# Patient Record
Sex: Female | Born: 2000 | Race: Black or African American | Hispanic: No | Marital: Single | Smoking: Never smoker
Health system: Southern US, Community
[De-identification: ages and names within clinical notes are randomized; demographics above are authoritative.]

## PROBLEM LIST (undated history)

## (undated) DIAGNOSIS — L940 Localized scleroderma [morphea]: Secondary | ICD-10-CM

## (undated) DIAGNOSIS — G43909 Migraine, unspecified, not intractable, without status migrainosus: Secondary | ICD-10-CM

## (undated) DIAGNOSIS — Z862 Personal history of diseases of the blood and blood-forming organs and certain disorders involving the immune mechanism: Secondary | ICD-10-CM

## (undated) DIAGNOSIS — D649 Anemia, unspecified: Secondary | ICD-10-CM

## (undated) DIAGNOSIS — I890 Lymphedema, not elsewhere classified: Secondary | ICD-10-CM

## (undated) HISTORY — DX: Lymphedema, not elsewhere classified: I89.0

## (undated) HISTORY — DX: Personal history of diseases of the blood and blood-forming organs and certain disorders involving the immune mechanism: Z86.2

## (undated) HISTORY — DX: Localized scleroderma (morphea): L94.0

## (undated) HISTORY — DX: Anemia, unspecified: D64.9

## (undated) HISTORY — PX: TONSILLECTOMY: SUR1361

## (undated) HISTORY — DX: Migraine, unspecified, not intractable, without status migrainosus: G43.909

---
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
## 2020-07-16 LAB — HM HEPATITIS C SCREENING LAB: HM Hepatitis Screen: NEGATIVE

## 2021-02-09 ENCOUNTER — Other Ambulatory Visit: Payer: Self-pay

## 2021-02-09 ENCOUNTER — Ambulatory Visit (INDEPENDENT_AMBULATORY_CARE_PROVIDER_SITE_OTHER): Payer: No Typology Code available for payment source | Admitting: Obstetrics & Gynecology

## 2021-02-09 ENCOUNTER — Encounter (HOSPITAL_BASED_OUTPATIENT_CLINIC_OR_DEPARTMENT_OTHER): Payer: Self-pay | Admitting: Obstetrics & Gynecology

## 2021-02-09 ENCOUNTER — Other Ambulatory Visit (HOSPITAL_COMMUNITY)
Admission: RE | Admit: 2021-02-09 | Discharge: 2021-02-09 | Disposition: A | Discharge status: Home / Self Care | Payer: BLUE CROSS/BLUE SHIELD | Source: Ambulatory Visit | Attending: Obstetrics & Gynecology | Admitting: Obstetrics & Gynecology

## 2021-02-09 VITALS — BP 154/77 | HR 57 | Ht 66.0 in | Wt 286.4 lb

## 2021-02-09 DIAGNOSIS — D509 Iron deficiency anemia, unspecified: Secondary | ICD-10-CM | POA: Insufficient documentation

## 2021-02-09 DIAGNOSIS — R300 Dysuria: Secondary | ICD-10-CM | POA: Insufficient documentation

## 2021-02-09 DIAGNOSIS — Z862 Personal history of diseases of the blood and blood-forming organs and certain disorders involving the immune mechanism: Secondary | ICD-10-CM

## 2021-02-09 DIAGNOSIS — N926 Irregular menstruation, unspecified: Secondary | ICD-10-CM

## 2021-02-09 DIAGNOSIS — N898 Other specified noninflammatory disorders of vagina: Secondary | ICD-10-CM | POA: Diagnosis not present

## 2021-02-09 DIAGNOSIS — I890 Lymphedema, not elsewhere classified: Secondary | ICD-10-CM | POA: Diagnosis not present

## 2021-02-09 DIAGNOSIS — R102 Pelvic and perineal pain: Secondary | ICD-10-CM

## 2021-02-09 NOTE — Progress Notes (Signed)
GYNECOLOGY  VISIT  CC:   pain with urination during menstrual cycle  HPI: 21 y.o. G0 Single Black or [place] American female here for complaint of painful urination during menstrual cycle up to a year ago.  Cycles are not regular.  She started her cycles when she was 12 and they've never been regular.  Last year, she did have an episode of bleeding that lasted four months.  She was very anemia and needed two iron infusions.  Currently, she is having pain down low that feels like it is near her uterus.  She has used contraceptives in the past.  She has taken POP which made her sluggish.  She's been on one combination OCP and this didn't really help cycles.  She's also tried Nuva ring and this helped with cycles but this also made her feel sluggish.   She has been SA.  Has not had recent STD testing.  Will obtain today.  Had Hb done 01/30/2021 showing some anemia.  No iron studies were done.     Patient Active Problem List   Diagnosis Date Noted   History of iron deficiency anemia 02/09/2021   Dysuria 02/09/2021    Past Medical History:  Diagnosis Date   History of anemia    h/o iron infusions x 2   Migraines     Past Surgical History:  Procedure Laterality Date   TONSILLECTOMY      MEDS:   Current Outpatient Medications on File Prior to Visit  Medication Sig Dispense Refill   Rimegepant Sulfate (NURTEC) 75 MG TBDP Take 75 mg by mouth every other day.     No current facility-administered medications on file prior to visit.    ALLERGIES: Patient has no allergy information on record.  Family History  Problem Relation Age of Onset   Breast cancer Maternal Grandmother    Hypertension Mother    Miscarriages / Stillbirths Other    Cancer Other     SH:  single, non smoker  Review of Systems  Genitourinary:        Irregular bleeding  All other systems reviewed and are negative.  PHYSICAL EXAMINATION:    BP (!) 154/77 (BP Location: Right Arm, Patient Position: Sitting,  Cuff Size: Large)    Pulse (!) 57    Ht 5\' 6"  (1.676 m) Comment: reported   Wt 286 lb 6.4 oz (129.9 kg)    LMP 01/28/2021 (Approximate)    BMI 46.23 kg/m     General appearance: alert, cooperative and appears stated age Abdomen: soft, mild RLQ tenderness, no rebound or guarding, bowel sounds normal; no masses,  no organomegaly Lymph:  no inguinal LAD noted  Pelvic: External genitalia:  no lesions              Urethra:  normal appearing urethra with no masses, tenderness or lesions              Bartholins and Skenes: normal                 Vagina: normal appearing vagina with normal color, frothy discharge present, no lesions              Cervix: no lesions              Bimanual Exam:  Uterus:  normal size, contour, position, consistency, mobility, non-tender              Adnexa: no mass, fullness, tenderness  Chaperone, 01/30/2021, CMA, was present for  exam.  Assessment/Plan:There are no diagnoses linked to this encounter. 1. History of iron deficiency anemia - had recent hb of 10.6 on 10/30/2021 - Iron, TIBC and Ferritin Panel  2. Dysuria - UA/M w/rflx Culture, Routine  3. Vaginal discharge - Cervicovaginal ancillary only( [facility])  4. Lymphedema - will see if can refer her to lymphedema clinic  5. Pelvic pain - consider pelvic ultrasound  6. Irregular periods/menstrual cycles - pt considering options for treatment of irregular bleeding

## 2021-02-10 ENCOUNTER — Other Ambulatory Visit (HOSPITAL_BASED_OUTPATIENT_CLINIC_OR_DEPARTMENT_OTHER): Payer: Self-pay | Admitting: Obstetrics & Gynecology

## 2021-02-10 ENCOUNTER — Encounter (HOSPITAL_BASED_OUTPATIENT_CLINIC_OR_DEPARTMENT_OTHER): Payer: Self-pay | Admitting: Obstetrics & Gynecology

## 2021-02-10 DIAGNOSIS — R102 Pelvic and perineal pain: Secondary | ICD-10-CM

## 2021-02-10 DIAGNOSIS — L940 Localized scleroderma [morphea]: Secondary | ICD-10-CM | POA: Insufficient documentation

## 2021-02-10 DIAGNOSIS — I890 Lymphedema, not elsewhere classified: Secondary | ICD-10-CM | POA: Insufficient documentation

## 2021-02-10 DIAGNOSIS — G43909 Migraine, unspecified, not intractable, without status migrainosus: Secondary | ICD-10-CM | POA: Insufficient documentation

## 2021-02-10 DIAGNOSIS — D649 Anemia, unspecified: Secondary | ICD-10-CM | POA: Insufficient documentation

## 2021-02-10 DIAGNOSIS — R300 Dysuria: Secondary | ICD-10-CM

## 2021-02-10 LAB — UA/M W/RFLX CULTURE, ROUTINE

## 2021-02-10 LAB — IRON,TIBC AND FERRITIN PANEL
Ferritin: 230 ng/mL — ABNORMAL HIGH (ref 15–150)
Iron Saturation: 15 % (ref 15–55)
Iron: 45 ug/dL (ref 27–159)
Total Iron Binding Capacity: 306 ug/dL (ref 250–450)
UIBC: 261 ug/dL (ref 131–425)

## 2021-02-10 LAB — CERVICOVAGINAL ANCILLARY ONLY
Bacterial Vaginitis (gardnerella): POSITIVE — AB
Candida Glabrata: NEGATIVE
Candida Vaginitis: POSITIVE — AB
Chlamydia: NEGATIVE
Comment: NEGATIVE
Comment: NEGATIVE
Comment: NEGATIVE
Comment: NEGATIVE
Comment: NEGATIVE
Comment: NORMAL
Neisseria Gonorrhea: NEGATIVE
Trichomonas: NEGATIVE

## 2021-02-11 ENCOUNTER — Other Ambulatory Visit (HOSPITAL_BASED_OUTPATIENT_CLINIC_OR_DEPARTMENT_OTHER): Payer: Self-pay

## 2021-02-11 ENCOUNTER — Other Ambulatory Visit (HOSPITAL_BASED_OUTPATIENT_CLINIC_OR_DEPARTMENT_OTHER): Payer: Self-pay | Admitting: *Deleted

## 2021-02-11 MED ORDER — FLUCONAZOLE 150 MG PO TABS
150.0000 mg | ORAL_TABLET | Freq: Once | ORAL | 0 refills | Status: AC
  Filled 2021-02-11: qty 2, 2d supply, fill #0

## 2021-02-11 MED ORDER — METRONIDAZOLE 0.75 % VA GEL
1.0000 | Freq: Every day | VAGINAL | 0 refills | Status: DC
  Filled 2021-02-11: qty 70, 5d supply, fill #0

## 2021-02-11 MED ORDER — METRONIDAZOLE 0.75 % VA GEL: 1.0000 | Freq: Every day | VAGINAL | Status: DC

## 2021-02-11 NOTE — Addendum Note (Signed)
Addended by: Harrie Jeans on: 02/11/2021 02:47 PM   Modules accepted: Orders

## 2021-02-11 NOTE — Progress Notes (Deleted)
° ° °  Patient referred by Shanon Rosser, PA-C for ***  Subjective:   Tammy [name], female    DOB: 09-07-2000, 21 y.o.   MRN: [id number]  *** No chief complaint on file.   *** HPI  21 y.o. *** female with ***  *** Past Medical History:  Diagnosis Date   History of anemia    h/o iron infusions x 2   Localized scleroderma (morphea)    Lymphedema    Migraines     *** Past Surgical History:  Procedure Laterality Date   TONSILLECTOMY      *** Social History   Tobacco Use  Smoking Status Never  Smokeless Tobacco Never    Social History   Substance and Sexual Activity  Alcohol Use Never    *** Family History  Problem Relation Age of Onset   Breast cancer Maternal Grandmother    Hypertension Mother    Miscarriages / Stillbirths Other    Cancer Other     *** Current Outpatient Medications on File Prior to Visit  Medication Sig Dispense Refill   fluconazole (DIFLUCAN) 150 MG tablet Take 1 tablet (150 mg total) by mouth once for 1 dose. Take 1 tablet once then repeat in 72 hours. 2 tablet 0   metroNIDAZOLE (METROGEL) 0.75 % vaginal gel Place 1 applicatorful vaginally at bedtime. Use for 5 nights. 70 g 0   Rimegepant Sulfate (NURTEC) 75 MG TBDP Take 75 mg by mouth every other day.     No current facility-administered medications on file prior to visit.    Cardiovascular and other pertinent studies:  *** EKG ***/***/202***: ***  ***  *** Recent labs: 01/09/2021: Glucose 83, BUN/Cr 10/1.0. EGFR 83. Na/K 138/4.2. Rest of the CMP normal H/H 11.7/36.8. MCV 92.0. Platelets 392 ***HbA1C ***% Chol ***, TG ***, HDL ***, LDL *** ***TSH 2.76 normal   *** ROS      *** There were no vitals filed for this visit.   There is no height or weight on file to calculate BMI. There were no vitals filed for this visit.  *** Objective:   Physical Exam    ***     Assessment & Recommendations:   ***  ***  Thank you for referring the patient to [place].  Please feel free to contact with any questions.   Nigel Mormon, MD Pager: [phone] Office: [phone]

## 2021-02-11 NOTE — Progress Notes (Signed)
Rx sent in for treatment of BV and yeast

## 2021-02-12 ENCOUNTER — Other Ambulatory Visit (HOSPITAL_BASED_OUTPATIENT_CLINIC_OR_DEPARTMENT_OTHER): Payer: Self-pay | Admitting: Nurse Practitioner

## 2021-02-12 ENCOUNTER — Other Ambulatory Visit (HOSPITAL_BASED_OUTPATIENT_CLINIC_OR_DEPARTMENT_OTHER): Payer: Self-pay

## 2021-02-12 ENCOUNTER — Ambulatory Visit (HOSPITAL_BASED_OUTPATIENT_CLINIC_OR_DEPARTMENT_OTHER): Payer: No Typology Code available for payment source | Admitting: Nurse Practitioner

## 2021-02-12 DIAGNOSIS — Z20828 Contact with and (suspected) exposure to other viral communicable diseases: Secondary | ICD-10-CM

## 2021-02-12 MED ORDER — OSELTAMIVIR PHOSPHATE 75 MG PO CAPS
75.0000 mg | ORAL_CAPSULE | Freq: Every day | ORAL | 0 refills | Status: DC
  Filled 2021-02-12: qty 10, 10d supply, fill #0

## 2021-02-15 LAB — UA/M W/RFLX CULTURE, ROUTINE
Bilirubin, UA: NEGATIVE
Glucose, UA: NEGATIVE
Ketones, UA: NEGATIVE
Leukocytes,UA: NEGATIVE
Nitrite, UA: NEGATIVE
Protein,UA: NEGATIVE
RBC, UA: NEGATIVE
Specific Gravity, UA: 1.026 (ref 1.005–1.030)
Urobilinogen, Ur: 0.2 mg/dL (ref 0.2–1.0)
pH, UA: 5.5 (ref 5.0–7.5)

## 2021-02-15 LAB — MICROSCOPIC EXAMINATION
Casts: NONE SEEN /lpf
Epithelial Cells (non renal): >10 /hpf — AB (ref 0–10)
RBC, Urine: NONE SEEN /hpf (ref 0–2)

## 2021-02-15 LAB — URINE CULTURE, REFLEX

## 2021-02-20 ENCOUNTER — Encounter (HOSPITAL_BASED_OUTPATIENT_CLINIC_OR_DEPARTMENT_OTHER): Payer: Self-pay | Admitting: Obstetrics & Gynecology

## 2021-02-20 ENCOUNTER — Ambulatory Visit: Payer: Self-pay | Admitting: Cardiology

## 2021-02-24 ENCOUNTER — Other Ambulatory Visit (HOSPITAL_BASED_OUTPATIENT_CLINIC_OR_DEPARTMENT_OTHER): Payer: Self-pay

## 2021-02-25 ENCOUNTER — Ambulatory Visit (HOSPITAL_BASED_OUTPATIENT_CLINIC_OR_DEPARTMENT_OTHER): Payer: No Typology Code available for payment source | Admitting: Obstetrics & Gynecology

## 2021-02-25 ENCOUNTER — Other Ambulatory Visit (HOSPITAL_BASED_OUTPATIENT_CLINIC_OR_DEPARTMENT_OTHER): Payer: No Typology Code available for payment source

## 2021-02-26 ENCOUNTER — Other Ambulatory Visit (HOSPITAL_BASED_OUTPATIENT_CLINIC_OR_DEPARTMENT_OTHER): Payer: Self-pay

## 2021-02-26 ENCOUNTER — Encounter (HOSPITAL_BASED_OUTPATIENT_CLINIC_OR_DEPARTMENT_OTHER): Payer: Self-pay | Admitting: Nurse Practitioner

## 2021-02-26 ENCOUNTER — Ambulatory Visit (HOSPITAL_BASED_OUTPATIENT_CLINIC_OR_DEPARTMENT_OTHER): Payer: Self-pay | Admitting: Nurse Practitioner

## 2021-02-26 ENCOUNTER — Ambulatory Visit (HOSPITAL_BASED_OUTPATIENT_CLINIC_OR_DEPARTMENT_OTHER): Payer: No Typology Code available for payment source | Admitting: Nurse Practitioner

## 2021-02-26 ENCOUNTER — Other Ambulatory Visit: Payer: Self-pay

## 2021-02-26 VITALS — BP 117/72 | HR 90 | Ht 66.0 in | Wt 292.0 lb

## 2021-02-26 DIAGNOSIS — M2550 Pain in unspecified joint: Secondary | ICD-10-CM

## 2021-02-26 DIAGNOSIS — G43719 Chronic migraine without aura, intractable, without status migrainosus: Secondary | ICD-10-CM

## 2021-02-26 DIAGNOSIS — Z6841 Body Mass Index (BMI) 40.0 and over, adult: Secondary | ICD-10-CM | POA: Diagnosis not present

## 2021-02-26 DIAGNOSIS — I890 Lymphedema, not elsewhere classified: Secondary | ICD-10-CM

## 2021-02-26 DIAGNOSIS — R109 Unspecified abdominal pain: Secondary | ICD-10-CM

## 2021-02-26 MED ORDER — TOPIRAMATE 50 MG PO TABS
50.0000 mg | ORAL_TABLET | Freq: Two times a day (BID) | ORAL | 3 refills | Status: DC
  Filled 2021-02-26: qty 30, 15d supply, fill #0
  Filled 2021-04-21: qty 30, 15d supply, fill #1

## 2021-02-26 MED ORDER — SEMAGLUTIDE-WEIGHT MANAGEMENT 0.25 MG/0.5ML ~~LOC~~ SOAJ
0.2500 mg | SUBCUTANEOUS | 0 refills | Status: DC
  Filled 2021-02-26: qty 2, 28d supply, fill #0

## 2021-02-26 MED ORDER — TAMSULOSIN HCL 0.4 MG PO CAPS
0.4000 mg | ORAL_CAPSULE | Freq: Every day | ORAL | 3 refills | Status: DC
  Filled 2021-02-26: qty 30, 30d supply, fill #0

## 2021-02-26 MED ORDER — SEMAGLUTIDE-WEIGHT MANAGEMENT 0.5 MG/0.5ML ~~LOC~~ SOAJ
0.5000 mg | SUBCUTANEOUS | 0 refills | Status: DC
  Filled 2021-02-26: qty 2, 28d supply, fill #0

## 2021-02-26 MED ORDER — SEMAGLUTIDE-WEIGHT MANAGEMENT 1 MG/0.5ML ~~LOC~~ SOAJ
1.0000 mg | SUBCUTANEOUS | 3 refills | Status: DC
  Filled 2021-02-26: qty 2, fill #0

## 2021-02-26 NOTE — Patient Instructions (Signed)
Thank you for choosing [facility] for your Primary Care needs. I am excited for the opportunity to partner with you to meet your health care goals. It was a pleasure meeting you today!  Information on diet, exercise, and health maintenance recommendations are listed below. This is information to help you be sure you are on track for optimal health and monitoring.   Please look over this and let us know if you have any questions or if you have completed any of the health maintenance outside of [facility] so that we can be sure your records are up to date.  ___________________________________________________________ About Me: I am an Adult-Geriatric Nurse Practitioner with a background in caring for patients for more than 20 years with a strong intensive care background. I provide primary care and sports medicine services to patients age 13 and older within this office. My education had a strong focus on caring for the older adult population, which I am passionate about. I am also the director of the APP [employer] with [place].   My desire is to provide you with the best service through preventive medicine and supportive care. I consider you a part of the medical team and value your input. I work diligently to ensure that you are heard and your needs are met in a safe and effective manner. I want you to feel comfortable with me as your provider and want you to know that your health concerns are important to me.  For your information, our office hours are: Monday, Tuesday, and Thursday 8:00 AM - 5:00 PM Wednesday and Friday 8:00 AM - 12:00 PM.   In my time away from the office I am teaching new APP's within the system and am unavailable, but my partner, Dr. deCuba is in the office for emergent needs.   If you have questions or concerns, please call our office at [phone] or send us a MyChart message and we will respond as quickly as possible.   ____________________________________________________________ MyChart:  For all urgent or time sensitive needs we ask that you please call the office to avoid delays. Our number is [phone]. MyChart is not constantly monitored and due to the large volume of messages a day, replies may take up to 72 business hours.  MyChart Policy: MyChart allows for you to see your visit notes, after visit summary, provider recommendations, lab and tests results, make an appointment, request refills, and contact your provider or the office for non-urgent questions or concerns. Providers are seeing patients during normal business hours and do not have built in time to review MyChart messages.  We ask that you allow a minimum of 3 business days for responses to MyChart messages. For this reason, please do not send urgent requests through MyChart. Please call the office at [phone]. New and ongoing conditions may require a visit. We have virtual and in person visit available for your convenience.  Complex MyChart concerns may require a visit. Your provider may request you schedule a virtual or in person visit to ensure we are providing the best care possible. MyChart messages sent after 11:00 AM on Friday will not be received by the provider until Monday morning.    Lab and Test Results: You will receive your lab and test results on MyChart as soon as they are completed and results have been sent by the lab or testing facility. Due to this service, you will receive your results BEFORE your provider.  I review   lab and tests results each morning prior to seeing patients. Some results require collaboration with other providers to ensure you are receiving the most appropriate care. For this reason, we ask that you please allow a minimum of 3-5 business days from the time the ALL results have been received for your provider to receive and review lab and test results and contact you about these.  Most lab and test  result comments from the provider will be sent through MyChart. Your provider may recommend changes to the plan of care, follow-up visits, repeat testing, ask questions, or request an office visit to discuss these results. You may reply directly to this message or call the office at [phone] to provide information for the provider or set up an appointment. In some instances, you will be called with test results and recommendations. Please let us know if this is preferred and we will make note of this in your chart to provide this for you.    If you have not heard a response to your lab or test results in 5 business days from all results returning to MyChart, please call the office to let us know. We ask that you please avoid calling prior to this time unless there is an emergent concern. Due to high call volumes, this can delay the resulting process.  After Hours: For all non-emergency after hours needs, please call the office at [phone] and select the option to reach the on-call provider service. On-call services are shared between multiple [place] offices and therefore it will not be possible to speak directly with your provider. On-call providers may provide medical advice and recommendations, but are unable to provide refills for maintenance medications.  For all emergency or urgent medical needs after normal business hours, we recommend that you seek care at the closest Urgent Care or Emergency Department to ensure appropriate treatment in a timely manner.  [facility] has a 24 hour emergency room located on the ground floor for your convenience.   Urgent Concerns During the Business Day Providers are seeing patients from 8AM to 5PM with a busy schedule and are most often not able to respond to non-urgent calls until the end of the day or the next business day. If you should have URGENT concerns during the day, please call and speak to the nurse or schedule a same  day appointment so that we can address your concern without delay.   Thank you, again, for choosing me as your health care partner. I appreciate your trust and look forward to learning more about you.   SaraBeth Angee Gupton, DNP, AGNP-c ___________________________________________________________  Health Maintenance Recommendations Screening Testing Mammogram Every 1 -2 years based on history and risk factors Starting at age 40 Pap Smear Ages 21-39 every 3 years Ages 30-65 every 5 years with HPV testing More frequent testing may be required based on results and history Colon Cancer Screening Every 1-10 years based on test performed, risk factors, and history Starting at age 45 Bone Density Screening Every 2-10 years based on history Starting at age 65 for women Recommendations for men differ based on medication usage, history, and risk factors AAA Screening One time ultrasound Men 65-75 years old who have every smoked Lung Cancer Screening Low Dose Lung CT every 12 months Age 55-80 years with a 30 pack-year smoking history who still smoke or who have quit within the last 15 years  Screening Labs Routine  Labs: Complete Blood Count (CBC), Complete Metabolic Panel (  CMP), Cholesterol (Lipid Panel) Every 6-12 months based on history and medications May be recommended more frequently based on current conditions or previous results Hemoglobin A1c Lab Every 3-12 months based on history and previous results Starting at age 45 or earlier with diagnosis of diabetes, high cholesterol, BMI >26, and/or risk factors Frequent monitoring for patients with diabetes to ensure blood sugar control Thyroid Panel (TSH w/ T3 & T4) Every 6 months based on history, symptoms, and risk factors May be repeated more often if on medication HIV One time testing for all patients 13 and older May be repeated more frequently for patients with increased risk factors or exposure Hepatitis C One time testing for  all patients 18 and older May be repeated more frequently for patients with increased risk factors or exposure Gonorrhea, Chlamydia Every 12 months for all sexually active persons 13-24 years Additional monitoring may be recommended for those who are considered high risk or who have symptoms PSA Men 40-54 years old with risk factors Additional screening may be recommended from age 55-69 based on risk factors, symptoms, and history  Vaccine Recommendations Tetanus Booster All adults every 10 years Flu Vaccine All patients 6 months and older every year COVID Vaccine All patients 12 years and older Initial dosing with booster May recommend additional booster based on age and health history HPV Vaccine 2 doses all patients age 9-26 Dosing may be considered for patients over 26 Shingles Vaccine (Shingrix) 2 doses all adults 55 years and older Pneumonia (Pneumovax 23) All adults 65 years and older May recommend earlier dosing based on health history Pneumonia (Prevnar 13) All adults 65 years and older Dosed 1 year after Pneumovax 23  Additional Screening, Testing, and Vaccinations may be recommended on an individualized basis based on family history, health history, risk factors, and/or exposure.  __________________________________________________________  Diet Recommendations for All Patients  I recommend that all patients maintain a diet low in saturated fats, carbohydrates, and cholesterol. While this can be challenging at first, it is not impossible and small changes can make big differences.  Things to try: Decreasing the amount of soda, sweet tea, and/or juice to one or less per day and replace with water While water is always the first choice, if you do not like water you may consider adding a water additive without sugar to improve the taste other sugar free drinks Replace potatoes with a brightly colored vegetable at dinner Use healthy oils, such as canola oil or olive  oil, instead of butter or hard margarine Limit your bread intake to two pieces or less a day Replace regular pasta with low carb pasta options Bake, broil, or grill foods instead of frying Monitor portion sizes  Eat smaller, more frequent meals throughout the day instead of large meals  An important thing to remember is, if you love foods that are not great for your health, you don't have to give them up completely. Instead, allow these foods to be a reward when you have done well. Allowing yourself to still have special treats every once in a while is a nice way to tell yourself thank you for working hard to keep yourself healthy.   Also remember that every day is a new day. If you have a bad day and "fall off the wagon", you can still climb right back up and keep moving along on your journey!  We have resources available to help you!  Some websites that may be helpful include: www.MyPlate.gov  Www.VeryWellFit.com _____________________________________________________________    Activity Recommendations for All Patients  I recommend that all adults get at least 20 minutes of moderate physical activity that elevates your heart rate at least 5 days out of the week.  Some examples include: Walking or jogging at a pace that allows you to carry on a conversation Cycling (stationary bike or outdoors) Water aerobics Yoga Weight lifting Dancing If physical limitations prevent you from putting stress on your joints, exercise in a pool or seated in a chair are excellent options.  Do determine your MAXIMUM heart rate for activity: YOUR AGE - 220 = MAX HeartRate   Remember! Do not push yourself too hard.  Start slowly and build up your pace, speed, weight, time in exercise, etc.  Allow your body to rest between exercise and get good sleep. You will need more water than normal when you are exerting yourself. Do not wait until you are thirsty to drink. Drink with a purpose of getting in at least  8, 8 ounce glasses of water a day plus more depending on how much you exercise and sweat.    If you begin to develop dizziness, chest pain, abdominal pain, jaw pain, shortness of breath, headache, vision changes, lightheadedness, or other concerning symptoms, stop the activity and allow your body to rest. If your symptoms are severe, seek emergency evaluation immediately. If your symptoms are concerning, but not severe, please let us know so that we can recommend further evaluation.     

## 2021-02-26 NOTE — Progress Notes (Signed)
Tollie Eth, DNP, AGNP-c Primary Care [facility] Sports Medicine [address]   [address] [place], [place] [address] [phone]  New patient visit   Patient: Tammy [name]   DOB: 02-Oct-2000   21 y.o. Female  MRN: [id number] Visit Date: 02/26/2021  Patient Care Team: Cristen Bredeson, Sung Amabile, NP as PCP - General (Nurse Practitioner)  Today's healthcare provider: Tollie Eth, NP   Chief Complaint  Patient presents with   New Patient (Initial Visit)    Patient presents today to establish care. She works at the front desk here in the office. Her only concern is that she has random pain through out her body daily. She is scheduled to see Dr Hyacinth Meeker 04/2021.   Subjective    Tammy [name] is a 21 y.o. female who presents today as a new patient to establish care.    Patient endorses the following concerns presently: Intermittent Pain Tammy [name] tells me that for the past several years she has been experiencing random pain in various places in her body for known known reason. She reports usually about once a week she will begin to experience pain in a particular joint and the pain will last throughout the week. She tells me that the pain can be severe at times causing difficulty walking, if in her lower extremities and lifting, if in her upper extremities. She reports the pain is not typically wide spread or in multiple joints at the same time. She has no known triggers for the pain. She tells me that this has become frustrating and is affecting her QOL.   Migraine Headaches Tammy [name] endorses frequent migraine headaches, occurring about 3x/wk and lasting about 1-2 hours. She reports they are typically located over the right eye or around the right temple/preauricular area. She endorses sensitivity to light and sound with the migraines. She endorses nausea, but no vomiting. She denies weakness, slurred speech, swallowing difficulties.  She has had nurtec in the past to help with headaches, but  reports she does not like this medication and would like to try something different as a preventative, if possible.   Pelvic pain She reports pelvic pain that started about 2-3 weeks ago. She reports the pain comes in a wave-like pattern and is dull in nature. She endorses the pain initially started in the flank on the left side, but seems to be migrating to the pelvic region now. She has had no changes in her BM or urine. No fevers or chills. No severe pain, nausea, or vomiting. She has never had anything like this in the past.   Weight / Lymphedema She endorses concerns with her weight. She tells me that her weight has been increasing over the past few years and she has found it very difficult to loose. She tells me that she does not get much physical activity due to time constraints with work. She has never been on medication for weight loss. She endorses that her weight is exacerbated by her LE lymphedema. She tells me this started in her childhood and no etiology was ever discovered. She reports that initially the edema was only present in one foot, but has now progressed and affects both lower extremities completely.   History reviewed and reveals the following: Past Medical History:  Diagnosis Date   Anemia    History of anemia    h/o iron infusions x 2   Localized scleroderma (morphea)    Lymphedema    Migraines    Past Surgical History:  Procedure Laterality Date   TONSILLECTOMY     Family Status  Relation Name Status   MGM  (Not Specified)   Mother Mother (Not Specified)   Other  (Not Specified)   Family History  Problem Relation Age of Onset   Breast cancer Maternal Grandmother    Hypertension Mother    Obesity Mother    Miscarriages / [place] Other    Cancer Other    Social History   Socioeconomic History   Marital status: Single    Spouse name: Not on file   Number of children: Not on file   Years of education: Not on file   Highest education level: Not on  file  Occupational History   Not on file  Tobacco Use   Smoking status: Never   Smokeless tobacco: Never  Vaping Use   Vaping Use: Never used  Substance and Sexual Activity   Alcohol use: Never   Drug use: Never   Sexual activity: Not Currently    Birth control/protection: Abstinence  Other Topics Concern   Not on file  Social History Narrative   Not on file   Social Determinants of Health   Financial Resource Strain: Not on file  Food Insecurity: Not on file  Transportation Needs: Not on file  Physical Activity: Not on file  Stress: Not on file  Social Connections: Not on file   Outpatient Medications Prior to Visit  Medication Sig   [DISCONTINUED] metroNIDAZOLE (METROGEL) 0.75 % vaginal gel Place 1 applicatorful vaginally at bedtime. Use for 5 nights.   [DISCONTINUED] oseltamivir (TAMIFLU) 75 MG capsule Take 1 capsule (75 mg total) by mouth daily.   [DISCONTINUED] Rimegepant Sulfate (NURTEC) 75 MG TBDP Take 75 mg by mouth every other day.   No facility-administered medications prior to visit.   Not on File Immunization History  Administered Date(s) Administered   Influenza-Unspecified 01/15/2021    Health Maintenance Due: Health Maintenance  Topic Date Due   COVID-19 Vaccine (1) Never done   HPV VACCINES (1 - 2-dose series) Never done   HIV Screening  Never done   Hepatitis C Screening  Never done   TETANUS/TDAP  Never done   PAP-Cervical Cytology Screening  02/14/2021   PAP SMEAR-Modifier  02/14/2021   INFLUENZA VACCINE  Completed    Review of Systems All review of systems negative except what is listed in the HPI   Objective    BP 117/72    Pulse 90    Ht 5\' 6"  (1.676 m)    Wt 292 lb (132.5 kg)    LMP 02/26/2021 (Approximate)    SpO2 97%    BMI 47.13 kg/m  Physical Exam Vitals and nursing note reviewed.  Constitutional:      Appearance: Normal appearance. She is obese.  HENT:     Head: Normocephalic.  Eyes:     Extraocular Movements: Extraocular  movements intact.     Conjunctiva/sclera: Conjunctivae normal.     Pupils: Pupils are equal, round, and reactive to light.  Neck:     Vascular: No carotid bruit.  Cardiovascular:     Rate and Rhythm: Normal rate and regular rhythm.     Pulses: Normal pulses.     Heart sounds: Normal heart sounds.  Pulmonary:     Effort: Pulmonary effort is normal.     Breath sounds: Normal breath sounds.  Abdominal:     General: Bowel sounds are normal. There is no distension.     Palpations: Abdomen is soft.  Tenderness: There is abdominal tenderness. There is no right CVA tenderness, left CVA tenderness, guarding or rebound.  Musculoskeletal:     Cervical back: Normal range of motion. No rigidity.     Right lower leg: Edema present.     Left lower leg: Edema present.  Lymphadenopathy:     Cervical: No cervical adenopathy.  Skin:    General: Skin is warm and dry.     Capillary Refill: Capillary refill takes less than 2 seconds.  Neurological:     General: No focal deficit present.     Mental Status: She is alert and oriented to person, place, and time.     Cranial Nerves: No cranial nerve deficit.     Sensory: No sensory deficit.     Motor: No weakness.     Gait: Gait normal.  Psychiatric:        Mood and Affect: Mood normal.        Behavior: Behavior normal.        Thought Content: Thought content normal.        Judgment: Judgment normal.    No results found for any visits on 02/26/21.  Assessment & Plan      Problem List Items Addressed This Visit     Lymphedema    Lymphedema of unknown etiology.  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  Recommend compression stockings to help reduce amount of edema in LE. Encourage alternative with standing, sitting, and walking while working to help reduce dependent edema which may make symptoms worse. When seated, keep feet elevated to  help with blood flow.  Will send referral to rheumatology given symptoms and history for further evaluation.       Relevant Orders   Ambulatory referral to Rheumatology   Migraine - Primary    Chronic. Not well controlled at this time.  Given the number of weekly headaches, feel that preventative treatment measures are needed.  Discussed medication options with patient. Joint decision to trial topiramate to see if this is helpful for headache reduction. She may continue to use nurtec as abortive treatment as needed. Will likely need to titrate dosage, but will monitor bi-weekly for this.  F/U if sx worsen or fail to improve in 2 weeks.       Relevant Medications   topiramate (TOPAMAX) 50 MG tablet   BMI 45.0-49.9, adult (HCC)    BMI 47.13 today Increased body weight despite diet. Discussed recommendations of diet and activity to help improve metabolism and facilitate weight loss. Limitations due to lymphedema and chronic joint pain do present a challenge.  Medication options discussed with patient today.  We will get labs for further evaluation, but initial decision to begin semaglutide made with patient.  If labs show presence of diabetes, consider Ozempic branding as this may be covered better than Wegovy. F/U in 3 months or sooner based on labs.       Relevant Medications   Semaglutide-Weight Management 0.25 MG/0.5ML SOAJ   Semaglutide-Weight Management 0.5 MG/0.5ML SOAJ (Start on 03/27/2021)   Semaglutide-Weight Management 1 MG/0.5ML SOAJ (Start on 04/25/2021)   Other Relevant Orders   CBC with Differential/Platelet   Comprehensive metabolic panel   Hemoglobin A1c   TSH   VITAMIN D 25 Hydroxy (Vit-D Deficiency, Fractures)   Iron, TIBC and Ferritin Panel   Arthralgia    Intermittent weekly joint pain in varying joints of unknown etiology.  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  Will test ANA today and  send referral to rheumatology for complete evaluation and recommendations.        Relevant Orders   Ambulatory referral to Rheumatology   Acute left flank pain    Acute left sided flank pain with radiation into LLQ and pelvic region. No alarm symptoms present today.  Symptoms and presentation very consistent with nephrolithiasis. Discussed option of imaging vs watchful waiting approach with tamsulosin to help facilitate ureter relaxation. Patient is agreeable to this.  Will trial tamsulosin for at least 2 weeks or until symptoms resolve. She does have appt with GYN in April.  Consider [place] evaluation if her pain is not resolved in two weeks or if pain intensifies or changes prior to that time.       Relevant Medications   tamsulosin (FLOMAX) 0.4 MG CAPS capsule   Other Relevant Orders   CBC with Differential/Platelet   Comprehensive metabolic panel   Iron, TIBC and Ferritin Panel     No follow-ups on file.     Jamiah Homeyer, Sung Amabile, NP, DNP, AGNP-C Primary [facility] Sports Medicine at [facility]

## 2021-02-27 DIAGNOSIS — R109 Unspecified abdominal pain: Secondary | ICD-10-CM | POA: Insufficient documentation

## 2021-02-27 DIAGNOSIS — M2550 Pain in unspecified joint: Secondary | ICD-10-CM | POA: Insufficient documentation

## 2021-02-27 NOTE — Assessment & Plan Note (Signed)
Intermittent weekly joint pain in varying joints of unknown etiology.  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  Will test ANA today and send referral to rheumatology for complete evaluation and recommendations.

## 2021-02-27 NOTE — Assessment & Plan Note (Signed)
BMI 47.13 today Increased body weight despite diet. Discussed recommendations of diet and activity to help improve metabolism and facilitate weight loss. Limitations due to lymphedema and chronic joint pain do present a challenge.  Medication options discussed with patient today.  We will get labs for further evaluation, but initial decision to begin semaglutide made with patient.  If labs show presence of diabetes, consider Ozempic branding as this may be covered better than Wegovy. F/U in 3 months or sooner based on labs.

## 2021-02-27 NOTE — Assessment & Plan Note (Signed)
Chronic. Not well controlled at this time.  Given the number of weekly headaches, feel that preventative treatment measures are needed.  Discussed medication options with patient. Joint decision to trial topiramate to see if this is helpful for headache reduction. She may continue to use nurtec as abortive treatment as needed. Will likely need to titrate dosage, but will monitor bi-weekly for this.  F/U if sx worsen or fail to improve in 2 weeks.

## 2021-02-27 NOTE — Assessment & Plan Note (Signed)
Acute left sided flank pain with radiation into LLQ and pelvic region. No alarm symptoms present today.  Symptoms and presentation very consistent with nephrolithiasis. Discussed option of imaging vs watchful waiting approach with tamsulosin to help facilitate ureter relaxation. Patient is agreeable to this.  Will trial tamsulosin for at least 2 weeks or until symptoms resolve. She does have appt with GYN in April.  Consider [place] evaluation if her pain is not resolved in two weeks or if pain intensifies or changes prior to that time.

## 2021-02-27 NOTE — Assessment & Plan Note (Signed)
Lymphedema of unknown etiology.  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  Recommend compression stockings to help reduce amount of edema in LE. Encourage alternative with standing, sitting, and walking while working to help reduce dependent edema which may make symptoms worse. When seated, keep feet elevated to help with blood flow.  Will send referral to rheumatology given symptoms and history for further evaluation.

## 2021-02-28 LAB — CBC WITH DIFFERENTIAL/PLATELET
Basophils Absolute: 0 10*3/uL (ref 0.0–0.2)
Basos: 1 %
EOS (ABSOLUTE): 0.1 10*3/uL (ref 0.0–0.4)
Eos: 1 %
Hematocrit: 34.7 % (ref 34.0–46.6)
Hemoglobin: 11.2 g/dL (ref 11.1–15.9)
Immature Grans (Abs): 0 10*3/uL (ref 0.0–0.1)
Immature Granulocytes: 0 %
Lymphocytes Absolute: 3.2 10*3/uL — ABNORMAL HIGH (ref 0.7–3.1)
Lymphs: 45 %
MCH: 29.7 pg (ref 26.6–33.0)
MCHC: 32.3 g/dL (ref 31.5–35.7)
MCV: 92 fL (ref 79–97)
Monocytes Absolute: 0.5 10*3/uL (ref 0.1–0.9)
Monocytes: 6 %
Neutrophils Absolute: 3.4 10*3/uL (ref 1.4–7.0)
Neutrophils: 47 %
Platelets: 438 10*3/uL (ref 150–450)
RBC: 3.77 x10E6/uL (ref 3.77–5.28)
RDW: 11.8 % (ref 11.7–15.4)
WBC: 7.2 10*3/uL (ref 3.4–10.8)

## 2021-02-28 LAB — COMPREHENSIVE METABOLIC PANEL
ALT: 12 IU/L (ref 0–32)
AST: 18 IU/L (ref 0–40)
Albumin/Globulin Ratio: 1.6 (ref 1.2–2.2)
Albumin: 4.3 g/dL (ref 3.9–5.0)
Alkaline Phosphatase: 67 IU/L (ref 44–121)
BUN/Creatinine Ratio: 17 (ref 9–23)
BUN: 13 mg/dL (ref 6–20)
Bilirubin Total: 0.2 mg/dL (ref 0.0–1.2)
CO2: 22 mmol/L (ref 20–29)
Calcium: 9.6 mg/dL (ref 8.7–10.2)
Chloride: 103 mmol/L (ref 96–106)
Creatinine, Ser: 0.78 mg/dL (ref 0.57–1.00)
Globulin, Total: 2.7 g/dL (ref 1.5–4.5)
Glucose: 63 mg/dL — ABNORMAL LOW (ref 70–99)
Potassium: 5.1 mmol/L (ref 3.5–5.2)
Sodium: 141 mmol/L (ref 134–144)
Total Protein: 7 g/dL (ref 6.0–8.5)
eGFR: 111 mL/min/{1.73_m2} (ref >59)

## 2021-02-28 LAB — IRON,TIBC AND FERRITIN PANEL
Ferritin: 193 ng/mL — ABNORMAL HIGH (ref 15–150)
Iron Saturation: 9 % — CL (ref 15–55)
Iron: 32 ug/dL (ref 27–159)
Total Iron Binding Capacity: 358 ug/dL (ref 250–450)
UIBC: 326 ug/dL (ref 131–425)

## 2021-02-28 LAB — HEMOGLOBIN A1C
Est. average glucose Bld gHb Est-mCnc: 82 mg/dL
Hgb A1c MFr Bld: 4.5 % — ABNORMAL LOW (ref 4.8–5.6)

## 2021-02-28 LAB — VITAMIN D 25 HYDROXY (VIT D DEFICIENCY, FRACTURES): Vit D, 25-Hydroxy: 21.5 ng/mL — ABNORMAL LOW (ref 30.0–100.0)

## 2021-02-28 LAB — TSH: TSH: 2.67 u[IU]/mL (ref 0.450–4.500)

## 2021-03-01 ENCOUNTER — Other Ambulatory Visit (HOSPITAL_BASED_OUTPATIENT_CLINIC_OR_DEPARTMENT_OTHER): Payer: Self-pay | Admitting: Nurse Practitioner

## 2021-03-01 DIAGNOSIS — E559 Vitamin D deficiency, unspecified: Secondary | ICD-10-CM

## 2021-03-01 MED ORDER — VITAMIN D (ERGOCALCIFEROL) 1.25 MG (50000 UNIT) PO CAPS
50000.0000 [IU] | ORAL_CAPSULE | ORAL | 0 refills | Status: DC
  Filled 2021-03-01: qty 12, 84d supply, fill #0

## 2021-03-02 ENCOUNTER — Other Ambulatory Visit (HOSPITAL_BASED_OUTPATIENT_CLINIC_OR_DEPARTMENT_OTHER): Payer: Self-pay

## 2021-03-02 ENCOUNTER — Other Ambulatory Visit (HOSPITAL_BASED_OUTPATIENT_CLINIC_OR_DEPARTMENT_OTHER): Payer: Self-pay | Admitting: Nurse Practitioner

## 2021-03-02 DIAGNOSIS — R110 Nausea: Secondary | ICD-10-CM

## 2021-03-02 MED ORDER — ONDANSETRON HCL 8 MG PO TABS
8.0000 mg | ORAL_TABLET | Freq: Three times a day (TID) | ORAL | 2 refills | Status: DC | PRN
  Filled 2021-03-02: qty 30, 10d supply, fill #0

## 2021-03-03 ENCOUNTER — Other Ambulatory Visit (HOSPITAL_BASED_OUTPATIENT_CLINIC_OR_DEPARTMENT_OTHER): Payer: Self-pay | Admitting: Nurse Practitioner

## 2021-03-03 ENCOUNTER — Encounter: Payer: Self-pay | Admitting: Family Medicine

## 2021-03-03 ENCOUNTER — Other Ambulatory Visit (HOSPITAL_BASED_OUTPATIENT_CLINIC_OR_DEPARTMENT_OTHER): Payer: Self-pay

## 2021-03-03 DIAGNOSIS — E162 Hypoglycemia, unspecified: Secondary | ICD-10-CM

## 2021-03-06 ENCOUNTER — Encounter (HOSPITAL_BASED_OUTPATIENT_CLINIC_OR_DEPARTMENT_OTHER): Payer: Self-pay | Admitting: Nurse Practitioner

## 2021-03-10 ENCOUNTER — Ambulatory Visit (HOSPITAL_BASED_OUTPATIENT_CLINIC_OR_DEPARTMENT_OTHER): Payer: Self-pay | Admitting: Nurse Practitioner

## 2021-03-10 ENCOUNTER — Ambulatory Visit (HOSPITAL_BASED_OUTPATIENT_CLINIC_OR_DEPARTMENT_OTHER): Payer: No Typology Code available for payment source | Admitting: Nurse Practitioner

## 2021-03-11 LAB — INSULIN, FREE AND TOTAL
Free Insulin: 39 uU/mL — ABNORMAL HIGH
Total Insulin: 39 uU/mL

## 2021-03-12 ENCOUNTER — Other Ambulatory Visit (HOSPITAL_BASED_OUTPATIENT_CLINIC_OR_DEPARTMENT_OTHER): Payer: Self-pay

## 2021-03-12 ENCOUNTER — Other Ambulatory Visit (HOSPITAL_BASED_OUTPATIENT_CLINIC_OR_DEPARTMENT_OTHER): Payer: Self-pay | Admitting: Nurse Practitioner

## 2021-03-12 DIAGNOSIS — F411 Generalized anxiety disorder: Secondary | ICD-10-CM

## 2021-03-12 MED ORDER — HYDROXYZINE HCL 50 MG PO TABS
50.0000 mg | ORAL_TABLET | Freq: Every evening | ORAL | 3 refills | Status: DC | PRN
  Filled 2021-03-12: qty 60, 30d supply, fill #0
  Filled 2021-04-21: qty 60, 30d supply, fill #1

## 2021-03-12 MED ORDER — ESCITALOPRAM OXALATE 10 MG PO TABS
ORAL_TABLET | ORAL | 5 refills | Status: DC
  Filled 2021-03-12: qty 30, 30d supply, fill #0
  Filled 2021-04-21: qty 30, 30d supply, fill #1

## 2021-03-13 LAB — INSULIN, FREE AND TOTAL
Free Insulin: 73 uU/mL — ABNORMAL HIGH
Total Insulin: 73 uU/mL

## 2021-03-16 NOTE — Addendum Note (Signed)
Addended by: Jacayla Nordell, [place] Dec E on: 03/16/2021 08:24 AM   Modules accepted: Orders

## 2021-03-19 ENCOUNTER — Other Ambulatory Visit (HOSPITAL_BASED_OUTPATIENT_CLINIC_OR_DEPARTMENT_OTHER): Payer: Self-pay

## 2021-03-25 ENCOUNTER — Encounter (HOSPITAL_BASED_OUTPATIENT_CLINIC_OR_DEPARTMENT_OTHER): Payer: Self-pay | Admitting: Nurse Practitioner

## 2021-03-25 ENCOUNTER — Other Ambulatory Visit: Payer: Self-pay

## 2021-03-25 ENCOUNTER — Ambulatory Visit (HOSPITAL_BASED_OUTPATIENT_CLINIC_OR_DEPARTMENT_OTHER): Payer: No Typology Code available for payment source | Admitting: Nurse Practitioner

## 2021-03-25 VITALS — BP 128/88 | HR 72 | Ht 66.0 in | Wt 291.0 lb

## 2021-03-25 DIAGNOSIS — E160 Drug-induced hypoglycemia without coma: Secondary | ICD-10-CM | POA: Diagnosis not present

## 2021-03-25 DIAGNOSIS — G43719 Chronic migraine without aura, intractable, without status migrainosus: Secondary | ICD-10-CM

## 2021-03-25 DIAGNOSIS — E161 Other hypoglycemia: Secondary | ICD-10-CM | POA: Diagnosis not present

## 2021-03-25 DIAGNOSIS — M2140 Flat foot [pes planus] (acquired), unspecified foot: Secondary | ICD-10-CM | POA: Insufficient documentation

## 2021-03-25 DIAGNOSIS — M722 Plantar fascial fibromatosis: Secondary | ICD-10-CM

## 2021-03-25 DIAGNOSIS — M2141 Flat foot [pes planus] (acquired), right foot: Secondary | ICD-10-CM

## 2021-03-25 DIAGNOSIS — R5382 Chronic fatigue, unspecified: Secondary | ICD-10-CM | POA: Diagnosis not present

## 2021-03-25 DIAGNOSIS — M2142 Flat foot [pes planus] (acquired), left foot: Secondary | ICD-10-CM

## 2021-03-25 DIAGNOSIS — F419 Anxiety disorder, unspecified: Secondary | ICD-10-CM

## 2021-03-25 DIAGNOSIS — T383X5A Adverse effect of insulin and oral hypoglycemic [antidiabetic] drugs, initial encounter: Secondary | ICD-10-CM | POA: Insufficient documentation

## 2021-03-25 HISTORY — DX: Anxiety disorder, unspecified: F41.9

## 2021-03-25 NOTE — Assessment & Plan Note (Signed)
Elevated insulin levels on 2 separate blood draws both fasting and non-fasting.  ?With associated symptoms, I am concerned with possible insulinoma presence. ?Discussed concerns with patient and her mother today. I recommend CT w/wo contrast of the abdomen for further evaluation.  ?Readings from CGM and fasting blood draws do show hypoglycemia and atypical presentation of glucose levels throughout the day and night.  ?If CT is unrevealing will consider testing for Cushings and/or endocrinology referral for further evaluation.  ? ?

## 2021-03-25 NOTE — Assessment & Plan Note (Signed)
Chronic hypoglycemic episodes despite food intake and appropriate monitoring. Symptoms of severe fatigue, weight gain, mental fog, headaches, and anxiety in the setting of elevated insulin levels are also present, which raise concern for insulinoma.  ?Will send her for CT of abdomen for further evaluation.  ?Discussed concerns with patient and her mother today and recommendations for further testing. Will plan to follow-up based on results of CT.  ?If negative, will consider testing for Cushings and referral to endocrinology for further evaluation and recommendations.  ?

## 2021-03-25 NOTE — Assessment & Plan Note (Signed)
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  ?Suspect condition has led to plantar fascitis. Recommend icing, stretching, and NSAIDs for relief of pain and inflammation. Recommend supportive footwear with arch support to help with prevention of further complications.  ?

## 2021-03-25 NOTE — Assessment & Plan Note (Signed)
Suspect relation to elevated insulin and low glucose levels present. CT to r/o insulinoma. See hyperinsulinemia.  ?

## 2021-03-25 NOTE — Patient Instructions (Addendum)
It was a pleasure seeing you today. I hope your time spent with [place] was pleasant and helpful. Please let us know if there is anything we can do to improve the service you receive.  ? ?Today we discussed concerns with: ? ?Hyperinsulinemia ? ?Hypoglycemia due to insulin ? ?Chronic fatigue ? ?Anxiety disorder, unspecified type ? ? ? ?The following orders have been placed for you today: ? ?Orders Placed This Encounter  ?Procedures  ? CT ABDOMEN W WO CONTRAST  ?  Standing Status:   Future  ?  Standing Expiration Date:   03/26/2022  ?  Scheduling Instructions:  ?   Please call patient to schedule ASAP  ?  Order Specific Question:   If indicated for the ordered procedure, I authorize the administration of contrast media per Radiology protocol  ?  Answer:   Yes  ?  Order Specific Question:   Is patient pregnant?  ?  Answer:   No  ?  Order Specific Question:   Preferred imaging location?  ?  Answer:   [facility]  ?  Order Specific Question:   Release to patient  ?  Answer:   Manual release only  ?  Order Specific Question:   Reason for preventing immediate release  ?  Answer:   Reasonable likelihood of causing patient harm [2]  ?  Order Specific Question:   Additional details for preventing immediate release  ?  Answer:   provider will discuss results with patient in person  ?  Order Specific Question:   Is Oral Contrast requested for this exam?  ?  Answer:   Yes, Per Radiology protocol  ? ?[facility] ?Located inside [facility] ?Address: [place], [place], [place] [address] ?Hours: 8am -5:30pm Monday through Friday ?[phone]  ? ? ?The following recommendations are made for your care: ?We will get the CT exam and then determine the next steps.  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 This is a tumor in the pancreas that releases excess insulin into the body and causes most of the symptoms you are experiencing.  ?If the CT scan is negative, we will determine next  steps. ?If the CT scan is positive, we will plan to get you in with a surgery consult to determine how best to proceed.  ? ?Important Office Information ?Lab Results ?If labs were ordered, please note that you will see results through [place] as soon as they come available from [place].  ?It takes up to 5 business days for the results to be routed to me and for me to review them once all of the lab results have come through from [facility]. I will make recommendations based on your results and send these through [place] or someone from the office will call you to discuss. If your labs are abnormal, we may contact you to schedule a visit to discuss the results and make recommendations.  ?If you have not heard from [place] within 5 business days or you have waited longer than a week and your lab results have not come through on [place], please feel free to call the office or send a message through [place] to follow-up on these labs.  ? ?Referrals ?If referrals were placed today, the office where the referral was sent will contact you either by phone or through [place] to set up scheduling. Please note that it can take up to a week for the referral office to contact you. If you  do not hear from them in a week, please contact the referral office directly to inquire about scheduling.  ? ?Condition Treated ?If your condition worsens or you begin to have new symptoms, please schedule a follow-up appointment for further evaluation. If you are not sure if an appointment is needed, you may call the office to leave a message for the nurse and someone will contact you with recommendations.  ?If you have an urgent or life threatening emergency, please do not call the office, but seek emergency evaluation by calling 911 or going to the nearest emergency room for evaluation.  ? ?MyChart and Phone Calls ?Please do not use MyChart for urgent messages. It may take up to 3 business days for MyChart messages to be read by staff and if they are  unable to handle the request, an additional 3 business days for them to be routed to me and for my response.  ?Messages sent to the provider through [place] do not come directly to the provider, please allow time for these messages to be routed and for me to respond.  ?We get a large volume of MyChart messages daily and these are responded to in the order received.  ? ?For urgent messages, please call the office at [phone] and speak with the front office staff or leave a message on the line of my assistant for guidance.  ?We are seeing patients from the hours of 8:00 am through 5:00 pm and calls directly to the nurse may not be answered immediately due to seeing patients, but your call will be returned as soon as possible.  ?Phone  messages received after 4:00 PM Monday through Thursday may not be returned until the following business day. Phone messages received after 11:00 AM on Friday may not be returned until Monday.  ? ?After Hours ?We share on call hours with providers from other offices. If you have an urgent need after hours that cannot wait until the next business day, please contact the on call provider by calling the office number. A nurse will speak with you and contact the provider if needed for recommendations.  ?If you have an urgent or life threatening emergency after hours, please do not call the on call provider, but seek emergency evaluation by calling 911 or going to the nearest emergency room for evaluation.  ? ?Paperwork ?All paperwork requires a minimum of 5 days to complete and return to you or the designated personnel. Please keep this in mind when bringing in forms or sending requests for paperwork completion to the office.  ?  ?

## 2021-03-25 NOTE — Assessment & Plan Note (Signed)
Pes planus present with tenderness to the plantar surface of the right foot along the entire length of the foot.  ?Recommend arch support and insoles with supportive shoes to help with stabilization of the arch.  ?Recommend icing the arch of the foot with frozen water bottle and rolling entire bottom of foot along the bottle at least three times a day to help with pain and inflammation.  ?Recommend stretches by writing ABC's with the foot.  ?Ibuprofen may be helpful for pain and inflammation.  ?If symptoms do not improve will send to podiatry for additional evaluation and recommendations.  ?

## 2021-03-25 NOTE — Assessment & Plan Note (Signed)
Chronic. Not well controlled.  ?Patient mistakenly taking tamsulosin as opposed to topiramate for migraine prevention.  ?Discussed with patient that topiramate is the appropriate medication for migraine prevention.  ?Recommend that she restart this and stop the tamsulosin as this was for suspected kidney stone.  ?Also plan to restart nurtec PRN for migraine headaches as they occur.  ?Will plan to follow-up in a few weeks to see if migraine symptoms are improving.  ?

## 2021-03-25 NOTE — Progress Notes (Signed)
Established Patient Office Visit  Subjective:  Patient ID: Tammy [name], female    DOB: [birth year]  Age: 21 y.o. MRN: [id number]  CC:  Chief Complaint  Patient presents with   Follow-up    Patient presents today to follow up to discuss free style and direction on what to do.    HPI Monae [place] presents for follow-up for blood glucose and insulin concerns.  Recent labs show elevation in insulin levels and hypoglycemia as well as low Hgb A1c levels.  Citlali has been wearing a CGM for monitoring of her BG levels.  Her current patterns show atypical presentation with response to intake of food with significant spikes and rapid drops in BG with meals and fluctuations greater than expected between meals.  Hypoglycemia has been detected on multiple occassions.   She is also experiencing severe fatigue and brain fog. She reports that she is sleeping well at night, but wakes feeling exhausted. She tells me that she has also gained about 60 pounds since last summer without significant change in her diet or activity levels.  She has also been experiencing more frequent and intense migraine headaches, anxiety, palpitations, and unusual MSK pains.   Onnika also has concerns for pain in the arch of her left foot that has been ongoing. She has been diagnosed with plantar fascitis, but noticed a "lump" on the medial portion of the arch of the foot this week. She is concerned this may be a new fibroid with her history of scleroderma. The lump is not painful, but she is experiencing pain more to the plantar surface in the same area.   Kiayla also has concerns with ongoing migraine headaches. She reports that she has been taking the prescribed medication for this, but she has not noticed any difference. She has not taken the nurtec. She reports that she is taking flomax and is not currently taking the topamax.  She denies weakness, dizziness, vision changes with the HA.    Outpatient Medications Prior to  Visit  Medication Sig Dispense Refill   escitalopram (LEXAPRO) 10 MG tablet Take 1/2 tab by mouth at bedtime for 4 days then increase to 1 tab by mouth at bedtime daily. 30 tablet 5   hydrOXYzine (ATARAX) 50 MG tablet Take 1 tablet (50 mg total) by mouth at bedtime for anxiety and sleep, may repeat dose one time if needed. 60 tablet 3   ondansetron (ZOFRAN) 8 MG tablet Take 1 tablet (8 mg total) by mouth every 8 (eight) hours as needed for nausea or vomiting. 30 tablet 2   topiramate (TOPAMAX) 50 MG tablet Take 1 tablet (50 mg total) by mouth 2 (two) times daily. 30 tablet 3   Vitamin D, Ergocalciferol, (DRISDOL) 1.25 MG (50000 UNIT) CAPS capsule Take 1 capsule (50,000 Units total) by mouth every 7 (seven) days. Take for 12 total doses(weeks) then can transition to 1000 units OTC supplement daily 12 capsule 0   Semaglutide-Weight Management 0.25 MG/0.5ML SOAJ Inject 0.25 mg into the skin once a week for 28 days. 2 mL 0   [START ON 03/27/2021] Semaglutide-Weight Management 0.5 MG/0.5ML SOAJ Inject 0.5 mg into the skin once a week for 28 days. 2 mL 0   [START ON 04/25/2021] Semaglutide-Weight Management 1 MG/0.5ML SOAJ Inject 1 mg into the skin once a week for 28 days. 2 mL 3   tamsulosin (FLOMAX) 0.4 MG CAPS capsule Take 1 capsule (0.4 mg total) by mouth daily. 30 capsule 3   No facility-administered medications  prior to visit.    Not on File  ROS Review of Systems All review of systems negative except what is listed in the HPI    Objective:    Physical Exam Vitals and nursing note reviewed.  Constitutional:      Appearance: Normal appearance. She is obese.  HENT:     Head: Normocephalic.  Eyes:     Extraocular Movements: Extraocular movements intact.     Conjunctiva/sclera: Conjunctivae normal.     Pupils: Pupils are equal, round, and reactive to light.  Cardiovascular:     Rate and Rhythm: Normal rate and regular rhythm.     Pulses: Normal pulses.     Heart sounds: Normal heart  sounds.  Pulmonary:     Effort: Pulmonary effort is normal.     Breath sounds: Normal breath sounds.  Abdominal:     General: Bowel sounds are normal. There is no distension.     Palpations: Abdomen is soft.     Tenderness: There is no abdominal tenderness. There is no guarding.  Musculoskeletal:        General: Tenderness present.     Cervical back: Normal range of motion.     Right lower leg: Edema present.     Left lower leg: Edema present.     Right foot: Tenderness present.     Left foot: Normal.       Legs:  Skin:    General: Skin is warm and dry.     Capillary Refill: Capillary refill takes less than 2 seconds.  Neurological:     General: No focal deficit present.     Mental Status: She is alert and oriented to person, place, and time.     Cranial Nerves: No cranial nerve deficit.     Motor: No weakness.     Coordination: Coordination normal.     Gait: Gait normal.  Psychiatric:        Mood and Affect: Mood normal.        Behavior: Behavior normal.        Thought Content: Thought content normal.        Judgment: Judgment normal.    BP 128/88   Pulse 72   Ht 5\' 6"  (1.676 m)   Wt 291 lb (132 kg)   LMP 02/26/2021 (Approximate)   SpO2 97%   BMI 46.97 kg/m  Wt Readings from Last 3 Encounters:  03/25/21 291 lb (132 kg)  02/26/21 292 lb (132.5 kg)  02/09/21 286 lb 6.4 oz (129.9 kg)      Assessment & Plan:   Problem List Items Addressed This Visit     Migraine    Chronic. Not well controlled.  Patient mistakenly taking tamsulosin as opposed to topiramate for migraine prevention.  Discussed with patient that topiramate is the appropriate medication for migraine prevention.  Recommend that she restart this and stop the tamsulosin as this was for suspected kidney stone.  Also plan to restart nurtec PRN for migraine headaches as they occur.  Will plan to follow-up in a few weeks to see if migraine symptoms are improving.       Hyperinsulinemia - Primary     Elevated insulin levels on 2 separate blood draws both fasting and non-fasting.  With associated symptoms, I am concerned with possible insulinoma presence. Discussed concerns with patient and her mother today. I recommend CT w/wo contrast of the abdomen for further evaluation.  Readings from CGM and fasting blood draws do show hypoglycemia and  atypical presentation of glucose levels throughout the day and night.  If CT is unrevealing will consider testing for Cushings and/or endocrinology referral for further evaluation.        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 Symptoms of severe fatigue, weight gain, mental fog, headaches, and anxiety in the setting of elevated insulin levels are also present, which raise concern for insulinoma.  Will send her for CT of abdomen for further evaluation.  Discussed concerns with patient and her mother today and recommendations for further testing. Will plan to follow-up based on results of CT.  If negative, will consider testing for Cushings and referral to endocrinology for further evaluation and recommendations.       Relevant Orders   CT ABDOMEN W CONTRAST   Chronic fatigue    Suspect relation to elevated insulin and low glucose levels present. CT to r/o insulinoma. See hyperinsulinemia.       Relevant Orders   CT ABDOMEN W CONTRAST   Anxiety disorder   Relevant Orders   CT ABDOMEN W CONTRAST   Plantar fasciitis of right foot    Pes planus present with tenderness to the plantar surface of the right foot along the entire length of the foot.  Recommend arch support and insoles with supportive shoes to help with stabilization of the arch.  Recommend icing the arch of the foot with frozen water bottle and rolling entire bottom of foot along the bottle at least three times a day to help with pain and inflammation.  Recommend stretches by  writing ABC's with the foot.  Ibuprofen may be helpful for pain and inflammation.  If symptoms do not improve will send to podiatry for additional evaluation and recommendations.       Flat foot    Bilateral pes planus with pain of the plantar fascia of the right foot.  Suspect condition has led to plantar fascitis. Recommend icing, stretching, and NSAIDs for relief of pain and inflammation. Recommend supportive footwear with arch support to help with prevention of further complications.        No orders of the defined types were placed in this encounter.   Follow-up: Return for based on CT results.    Tollie Eth, NP

## 2021-03-30 ENCOUNTER — Telehealth (HOSPITAL_BASED_OUTPATIENT_CLINIC_OR_DEPARTMENT_OTHER): Payer: Self-pay

## 2021-03-30 NOTE — Telephone Encounter (Signed)
I spoke with Tammy [name] at [facility]. The case # for CT abd is [id number], it requires a peer to peer review. When I spoke with Tammy [name] she informed me that the patient was aware and the appointment had been canceled.  ?

## 2021-03-31 ENCOUNTER — Other Ambulatory Visit: Payer: No Typology Code available for payment source

## 2021-04-01 ENCOUNTER — Ambulatory Visit (HOSPITAL_BASED_OUTPATIENT_CLINIC_OR_DEPARTMENT_OTHER): Payer: No Typology Code available for payment source | Admitting: Obstetrics & Gynecology

## 2021-04-01 ENCOUNTER — Ambulatory Visit (INDEPENDENT_AMBULATORY_CARE_PROVIDER_SITE_OTHER): Payer: No Typology Code available for payment source

## 2021-04-01 ENCOUNTER — Other Ambulatory Visit (HOSPITAL_BASED_OUTPATIENT_CLINIC_OR_DEPARTMENT_OTHER): Payer: Self-pay

## 2021-04-01 ENCOUNTER — Other Ambulatory Visit: Payer: Self-pay

## 2021-04-01 DIAGNOSIS — R9389 Abnormal findings on diagnostic imaging of other specified body structures: Secondary | ICD-10-CM | POA: Diagnosis not present

## 2021-04-01 DIAGNOSIS — R102 Pelvic and perineal pain: Secondary | ICD-10-CM | POA: Diagnosis not present

## 2021-04-01 DIAGNOSIS — R188 Other ascites: Secondary | ICD-10-CM

## 2021-04-01 MED ORDER — MEDROXYPROGESTERONE ACETATE 10 MG PO TABS
10.0000 mg | ORAL_TABLET | Freq: Every day | ORAL | 0 refills | Status: DC
  Filled 2021-04-01: qty 10, 10d supply, fill #0

## 2021-04-02 ENCOUNTER — Other Ambulatory Visit (HOSPITAL_BASED_OUTPATIENT_CLINIC_OR_DEPARTMENT_OTHER): Payer: Self-pay | Admitting: Nurse Practitioner

## 2021-04-03 ENCOUNTER — Other Ambulatory Visit (HOSPITAL_BASED_OUTPATIENT_CLINIC_OR_DEPARTMENT_OTHER): Payer: Self-pay | Admitting: Nurse Practitioner

## 2021-04-03 DIAGNOSIS — E161 Other hypoglycemia: Secondary | ICD-10-CM

## 2021-04-03 DIAGNOSIS — R1900 Intra-abdominal and pelvic swelling, mass and lump, unspecified site: Secondary | ICD-10-CM

## 2021-04-03 DIAGNOSIS — T383X5A Adverse effect of insulin and oral hypoglycemic [antidiabetic] drugs, initial encounter: Secondary | ICD-10-CM

## 2021-04-03 DIAGNOSIS — R109 Unspecified abdominal pain: Secondary | ICD-10-CM

## 2021-04-07 ENCOUNTER — Encounter (HOSPITAL_BASED_OUTPATIENT_CLINIC_OR_DEPARTMENT_OTHER): Payer: Self-pay | Admitting: Obstetrics & Gynecology

## 2021-04-07 ENCOUNTER — Telehealth (HOSPITAL_BASED_OUTPATIENT_CLINIC_OR_DEPARTMENT_OTHER): Payer: Self-pay

## 2021-04-07 ENCOUNTER — Other Ambulatory Visit (HOSPITAL_BASED_OUTPATIENT_CLINIC_OR_DEPARTMENT_OTHER): Payer: Self-pay

## 2021-04-07 NOTE — Telephone Encounter (Signed)
Received fax from [employer] plan CT of stomach without contrast is covered for 45 days from the date 04/03/2021.  ?

## 2021-04-07 NOTE — Progress Notes (Signed)
GYNECOLOGY  VISIT ? ?CC:   discuss ultrasound, additional recommendations ? ?HPI: ?21 y.o. G0P0000 Single Black or [place] American female here for additional evaluation of pelvic pain and painful urination during menstrual cycles.  Pt does hav hx of abnormal menstrual cycle last year requiring two iron infusions.  She has been on POPs which made her feel sluggish, combination OCPs which didn't really help cycles and Nuva ring that also had side effects.  She has been considering IUD. ? ?Ultrasound findings reviewed with pt.  Uterus normal.  Ovaries without any masses.  Cul de sac does contain fluid that is complex with septations and possible mass.   ? ?Pt reports Enid Skeens, PCP, is concerned she may have an insulinoma and CT abdomen is planned.  She is having some difficulty with insurance approval.  Advised will speak with her especially regarding cul de sac finding so that we can hopefully proceed with CT abd/pelvis for additional evaluation. ? ?Do feel reasonable to proceed with IUD placement.  Would recommend with cycle due to endometrium of >52mm to decrease the amount of irregular spotting she will have with placement.  Pt will let me know when starts menstrual cycle. ? ?Patient Active Problem List  ? Diagnosis Date Noted  ? 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  ? 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 Chronic fatigue 03/25/2021  ? Anxiety disorder 03/25/2021  ? Plantar fasciitis of right foot 03/25/2021  ? Flat foot 03/25/2021  ? Arthralgia 02/27/2021  ? Acute left flank pain 02/27/2021  ? BMI 45.0-49.9, adult (HCC) 02/26/2021  ? 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 Migraine 02/10/2021  ? History of iron deficiency anemia 02/09/2021  ? ? ?Past Medical History:  ?Diagnosis Date  ? Anemia   ? Anxiety disorder 03/25/2021  ? History of anemia   ? h/o iron infusions x 2  ? Localized scleroderma (morphea)   ? Lymphedema   ? Migraines   ? ? ?Past Surgical History:  ?Procedure Laterality  Date  ? TONSILLECTOMY    ? ? ?MEDS:   ?Current Outpatient Medications on File Prior to Visit  ?Medication Sig Dispense Refill  ? escitalopram (LEXAPRO) 10 MG tablet Take 1/2 tab by mouth at bedtime for 4 days then increase to 1 tab by mouth at bedtime daily. 30 tablet 5  ? hydrOXYzine (ATARAX) 50 MG tablet Take 1 tablet (50 mg total) by mouth at bedtime for anxiety and sleep, may repeat dose one time if needed. 60 tablet 3  ? ondansetron (ZOFRAN) 8 MG tablet Take 1 tablet (8 mg total) by mouth every 8 (eight) hours as needed for nausea or vomiting. 30 tablet 2  ? topiramate (TOPAMAX) 50 MG tablet Take 1 tablet (50 mg total) by mouth 2 (two) times daily. 30 tablet 3  ? Vitamin D, Ergocalciferol, (DRISDOL) 1.25 MG (50000 UNIT) CAPS capsule Take 1 capsule (50,000 Units total) by mouth every 7 (seven) days. Take for 12 total doses(weeks) then can transition to 1000 units OTC supplement daily 12 capsule 0  ? ?No current facility-administered medications on file prior to visit.  ? ? ?ALLERGIES: Patient has no allergy information on record. ? ?Family History  ?Problem Relation Age of Onset  ? Breast cancer Maternal Grandmother   ? Hypertension Mother   ? Obesity Mother   ? Miscarriages / Stillbirths Other   ? Cancer Other   ? ? ?SH:  single, non smoker ? ?Review of Systems  ?Constitutional: Negative.   ?Genitourinary:   ?  Pelvic pain  ? ?PHYSICAL EXAMINATION:   ?  ?General appearance: alert, cooperative and appears stated age ?No other physical exam performed ? ?Assessment/Plan: ?1. Pelvic fluid collection ?- will communicated with pt's PCP regarding finding so we can proceed with CT abd/pelvis for additional evaluation.   Pt comfortable with plan. ? ?2. Abnormal ultrasound of pelvis ? ?3. Pelvic pain  ?- plan IUD placement with onset of next cycle.  As cycle is not regular, will give provera 10mg  x 10 days to trigger cycle and plan placement after that time.  Rx to pharmacy. ? ?

## 2021-04-08 ENCOUNTER — Other Ambulatory Visit (HOSPITAL_BASED_OUTPATIENT_CLINIC_OR_DEPARTMENT_OTHER): Payer: No Typology Code available for payment source

## 2021-04-08 ENCOUNTER — Encounter (HOSPITAL_BASED_OUTPATIENT_CLINIC_OR_DEPARTMENT_OTHER): Payer: Self-pay

## 2021-04-08 ENCOUNTER — Ambulatory Visit (HOSPITAL_BASED_OUTPATIENT_CLINIC_OR_DEPARTMENT_OTHER): Payer: No Typology Code available for payment source | Admitting: Obstetrics & Gynecology

## 2021-04-09 ENCOUNTER — Ambulatory Visit
Admission: RE | Admit: 2021-04-09 | Discharge: 2021-04-09 | Disposition: A | Discharge status: Home / Self Care | Payer: No Typology Code available for payment source | Source: Ambulatory Visit | Attending: Nurse Practitioner | Admitting: Nurse Practitioner

## 2021-04-09 DIAGNOSIS — E160 Drug-induced hypoglycemia without coma: Secondary | ICD-10-CM

## 2021-04-09 DIAGNOSIS — E161 Other hypoglycemia: Secondary | ICD-10-CM

## 2021-04-09 DIAGNOSIS — R109 Unspecified abdominal pain: Secondary | ICD-10-CM

## 2021-04-09 DIAGNOSIS — R1900 Intra-abdominal and pelvic swelling, mass and lump, unspecified site: Secondary | ICD-10-CM

## 2021-04-09 MED ORDER — IOPAMIDOL (ISOVUE-300) INJECTION 61%
100.0000 mL | Freq: Once | INTRAVENOUS | Status: AC | PRN
  Administered 2021-04-09: 100 mL via INTRAVENOUS

## 2021-04-16 ENCOUNTER — Other Ambulatory Visit (HOSPITAL_BASED_OUTPATIENT_CLINIC_OR_DEPARTMENT_OTHER): Payer: Self-pay

## 2021-04-16 DIAGNOSIS — E161 Other hypoglycemia: Secondary | ICD-10-CM

## 2021-04-21 ENCOUNTER — Ambulatory Visit (HOSPITAL_BASED_OUTPATIENT_CLINIC_OR_DEPARTMENT_OTHER): Payer: No Typology Code available for payment source | Admitting: Obstetrics & Gynecology

## 2021-04-21 ENCOUNTER — Encounter (HOSPITAL_BASED_OUTPATIENT_CLINIC_OR_DEPARTMENT_OTHER): Payer: Self-pay | Admitting: Obstetrics & Gynecology

## 2021-04-21 ENCOUNTER — Other Ambulatory Visit (HOSPITAL_BASED_OUTPATIENT_CLINIC_OR_DEPARTMENT_OTHER): Payer: Self-pay

## 2021-04-21 VITALS — BP 122/80 | HR 82 | Wt 295.4 lb

## 2021-04-21 DIAGNOSIS — R9389 Abnormal findings on diagnostic imaging of other specified body structures: Secondary | ICD-10-CM | POA: Diagnosis not present

## 2021-04-21 DIAGNOSIS — Z30430 Encounter for insertion of intrauterine contraceptive device: Secondary | ICD-10-CM | POA: Diagnosis not present

## 2021-04-21 MED ORDER — IBUPROFEN 800 MG PO TABS
800.0000 mg | ORAL_TABLET | Freq: Three times a day (TID) | ORAL | 1 refills | Status: DC | PRN
  Filled 2021-04-21: qty 30, 10d supply, fill #0

## 2021-04-21 MED ORDER — LEVONORGESTREL 20 MCG/DAY IU IUD
1.0000 | INTRAUTERINE_SYSTEM | Freq: Once | INTRAUTERINE | Status: AC
  Administered 2021-04-21: 1 via INTRAUTERINE

## 2021-04-21 NOTE — Progress Notes (Signed)
21 y.o. G0P0000 Single African American female presents for insertion of Mirena IUD.  Pt has been counseled about alternative forms of contraception including OCPs, progesterone options, condoms, and natural family planning.  She feels IUD is the better option for her.  She is hoping, as well, that cycle improvement will occur.  Pt's mother is with her today.  We discussed risks and benefits as well as complication of IUD use.  Cramping and typical post placement bleeding discussed.  Amenorrhea rate at 1 and 2 years reviewed as well.  All questions answered prior to start of procedure.   ? ?Pt did have CT of abdomen and pelvis.  Reviewed results today.  On ultrasound, pt has fluid in cul de sac with septation present.  On CT, small amount of fluid noted but no other abnormal findings except umbilical hernia.  Exam does not show significant umbilical hernia so she does not need any treatment for this at this time.  Questions answered for pt and her mother. ? ?Current contraception: none, LMP 04/12/2021.  Not SA since menstrual cycle. ?Last STD testing:  02/09/2021 ? ?Patient Active Problem List  ? Diagnosis Date Noted  ? 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  ? 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 Chronic fatigue 03/25/2021  ? Anxiety disorder 03/25/2021  ? Plantar fasciitis of right foot 03/25/2021  ? Flat foot 03/25/2021  ? Arthralgia 02/27/2021  ? Acute left flank pain 02/27/2021  ? BMI 45.0-49.9, adult (HCC) 02/26/2021  ? 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 Migraine 02/10/2021  ? History of iron deficiency anemia 02/09/2021  ? ?Past Medical History:  ?Diagnosis Date  ? Anemia   ? Anxiety disorder 03/25/2021  ? History of anemia   ? h/o iron infusions x 2  ? Localized scleroderma (morphea)   ? Lymphedema   ? Migraines   ? ?Current Outpatient Medications on File Prior to Visit  ?Medication Sig Dispense Refill  ? escitalopram (LEXAPRO) 10 MG tablet Take 1/2 tab by mouth at bedtime for 4 days  then increase to 1 tab by mouth at bedtime daily. 30 tablet 5  ? hydrOXYzine (ATARAX) 50 MG tablet Take 1 tablet (50 mg total) by mouth at bedtime for anxiety and sleep, may repeat dose one time if needed. 60 tablet 3  ? ondansetron (ZOFRAN) 8 MG tablet Take 1 tablet (8 mg total) by mouth every 8 (eight) hours as needed for nausea or vomiting. 30 tablet 2  ? topiramate (TOPAMAX) 50 MG tablet Take 1 tablet (50 mg total) by mouth 2 (two) times daily. 30 tablet 3  ? Vitamin D, Ergocalciferol, (DRISDOL) 1.25 MG (50000 UNIT) CAPS capsule Take 1 capsule (50,000 Units total) by mouth every 7 (seven) days. Take for 12 total doses(weeks) then can transition to 1000 units OTC supplement daily 12 capsule 0  ? ?No current facility-administered medications on file prior to visit.  ? ?Patient has no allergy information on record. ? ?Review of Systems  ?Constitutional: Negative.   ?Genitourinary: Negative.   ?Vitals:  ? 04/21/21 1715  ?BP: 122/80  ?Pulse: 82  ?Weight: 295 lb 6.4 oz (134 kg)  ? ? ?Gen:  WNWF healthy female NAD ?Abdomen: soft, non-tender ?Groin:  no inguinal nodes palpated ? ?Pelvic exam: ?Vulva:  normal female genitalia ?Vagina:  normal vagina ?Cervix:  Non-tender, Negative CMT, no lesions or redness. ?Uterus:  normal shape, position and consistency  ? ?Procedure:  Speculum reinserted.  Cervix visualized and cleansed with Betadine x 3.  Paracervical block was not placed.  Single toothed tenaculum applied to anterior lip of cervix without difficulty.  1% Lidocaine without epinephrine was used.  Uterus sounded to 8cm.  Mirena IUD package was opened.  IUD and introducer passed to fundus and then withdrawn slightly before IUD was passed into endometrial cavity.  Introducer removed.  Strings cut to 2cm.  Tenaculum removed from cervix.  Minimal bleeding noted.  Pt tolerated the procedure well.  All instruments removed from vagina. ? ?Assessment/Plan: ?1. Encounter for insertion of mirena IUD ?- levonorgestrel (MIRENA) 20  MCG/DAY IUD 1 each    ?- Return for recheck 6-8 weeks ?- Pt aware to call for any concerns ?- Pt aware removal due no later than 04/21/2029.  IUD card given to pt. ? ?2.  Abnormal ultrasound of the pelvis  ?- with fluid in PCDS and septation present ?- with CT showing no other significant findings, feel this can be followed conservatively.   ? ?

## 2021-04-28 ENCOUNTER — Ambulatory Visit: Payer: No Typology Code available for payment source | Admitting: "Endocrinology

## 2021-04-28 ENCOUNTER — Encounter: Payer: Self-pay | Admitting: "Endocrinology

## 2021-04-28 VITALS — BP 122/88 | HR 80 | Ht 66.0 in | Wt 294.2 lb

## 2021-04-28 DIAGNOSIS — E161 Other hypoglycemia: Secondary | ICD-10-CM

## 2021-04-28 NOTE — Progress Notes (Signed)
? ?    Endocrinology Consult Note ?                                           04/28/2021, 4:29 PM ? ? ?Subjective:  ? ? Patient ID: Tammy PlowmanLaneya [name], female    DOB: 2000/09/01, PCP Early, Sung AmabileSara E, NP ? ? ?Past Medical History:  ?Diagnosis Date  ? Anemia   ? Anxiety disorder 03/25/2021  ? History of anemia   ? h/o iron infusions x 2  ? Localized scleroderma (morphea)   ? Lymphedema   ? Migraines   ? ?Past Surgical History:  ?Procedure Laterality Date  ? TONSILLECTOMY    ? ?Social History  ? ?Socioeconomic History  ? Marital status: Single  ?  Spouse name: Not on file  ? Number of children: Not on file  ? Years of education: Not on file  ? Highest education level: Not on file  ?Occupational History  ? Not on file  ?Tobacco Use  ? Smoking status: Never  ? Smokeless tobacco: Never  ?Vaping Use  ? Vaping Use: Never used  ?Substance and Sexual Activity  ? Alcohol use: Never  ? Drug use: Never  ? Sexual activity: Not Currently  ?  Birth control/protection: Abstinence  ?Other Topics Concern  ? Not on file  ?Social History Narrative  ? Not on file  ? ?Social Determinants of Health  ? ?Financial Resource Strain: Not on file  ?Food Insecurity: Not on file  ?Transportation Needs: Not on file  ?Physical Activity: Not on file  ?Stress: Not on file  ?Social Connections: Not on file  ? ?Family History  ?Problem Relation Age of Onset  ? Breast cancer Maternal Grandmother   ? Hypertension Mother   ? Obesity Mother   ? Miscarriages / Stillbirths Other   ? Cancer Other   ? ?Outpatient Encounter Medications as of 04/28/2021  ?Medication Sig  ? escitalopram (LEXAPRO) 10 MG tablet Take 1/2 tab by mouth at bedtime for 4 days then increase to 1 tab by mouth at bedtime daily.  ? hydrOXYzine (ATARAX) 50 MG tablet Take 1 tablet (50 mg total) by mouth at bedtime for anxiety and sleep, may repeat dose one time if needed.  ? ibuprofen (ADVIL) 800 MG tablet Take 1 tablet (800 mg total) by mouth every 8 (eight) hours as needed.  ? ondansetron (ZOFRAN)  8 MG tablet Take 1 tablet (8 mg total) by mouth every 8 (eight) hours as needed for nausea or vomiting.  ? topiramate (TOPAMAX) 50 MG tablet Take 1 tablet (50 mg total) by mouth 2 (two) times daily.  ? Vitamin D, Ergocalciferol, (DRISDOL) 1.25 MG (50000 UNIT) CAPS capsule Take 1 capsule (50,000 Units total) by mouth every 7 (seven) days. Take for 12 total doses(weeks) then can transition to 1000 units OTC supplement daily  ? ?No facility-administered encounter medications on file as of 04/28/2021.  ? ?ALLERGIES: ?No Known Allergies ? ?VACCINATION STATUS: ?Immunization History  ?Administered Date(s) Administered  ? Influenza-Unspecified 01/15/2021  ? ? ?HPI ?Tammy [name] is 21 y.o. female who presents today with a medical history as above. she is being seen in consultation for hyperinsulinemia requested by Early, Sung AmabileSara E, NP.   ?History is obtained from the patient as well as chart review.  She denies any prior history of diabetes nor taking diabetes medications. ?She has medical history of being overweight/obese  for most of her adult life. ?Due to intermittent fatigue, she underwent lab work in February 2023 which showed elevated free insulin of 39 on February 23, 2021.  Repeat labs on March 04, 2021 showed even higher free insulin of 73.  Her hemoglobin A1c was 4.5%.   ?She did not bring any logs nor meter to's review, however verbally she reports that she documented hypoglycemia as low as in the 50s. ?These have been randomly, and mainly following consumption of processed or ultra processed carbs and sweetened beverages. ?She has attempted to lose weight, with no significant success. ?She has family history of hypertension.  Her current medications including antidepressants Lexapro, vitamin D, Topamax, ibuprofen. ? ?Review of Systems ? ?Constitutional: + Minimally fluctuating body weight, progressive weight gain.  + fatigue, no subjective hyperthermia, no subjective hypothermia ?Eyes: no blurry vision, no  xerophthalmia ?ENT: no sore throat, no nodules palpated in throat, no dysphagia/odynophagia, no hoarseness ?Cardiovascular: no Chest Pain, no Shortness of Breath, no palpitations, no leg swelling ?Respiratory: no cough, no shortness of breath ?Gastrointestinal: no Nausea/Vomiting/Diarhhea ?Musculoskeletal: no muscle/joint aches ?Skin: no rashes ?Neurological: no tremors, no numbness, no tingling, no dizziness ?Psychiatric: no depression, no anxiety ? ?Objective:  ?  ? ?  04/28/2021  ?  2:35 PM 04/21/2021  ?  5:15 PM 03/25/2021  ?  8:11 AM  ?Vitals with BMI  ?Height 5\' 6"   5\' 6"   ?Weight 294 lbs 3 oz 295 lbs 6 oz 291 lbs  ?BMI 47.51  46.99  ?Systolic 122 122  ?Diastolic 88 80 88  ?Pulse 80 82 72  ? ? ?BP 122/88   Pulse 80   Ht 5\' 6"  (1.676 m)   Wt 294 lb 3.2 oz (133.4 kg)   BMI 47.49 kg/m?   ?Wt Readings from Last 3 Encounters:  ?04/28/21 294 lb 3.2 oz (133.4 kg)  ?04/21/21 295 lb 6.4 oz (134 kg)  ?03/25/21 291 lb (132 kg)  ?  ?Physical Exam ? ?Constitutional:  Body mass index is 47.49 kg/m?.,  not in acute distress, normal state of mind ?Eyes: PERRLA, EOMI, no exophthalmos ?ENT: moist mucous membranes. ? ?Musculoskeletal: no gross deformities, strength intact in all four extremities ?Skin: moist, warm, no rashes ?Neurological: no tremor with outstretched hands, Deep tendon reflexes normal in bilateral lower extremities. ? ?CMP ( most recent) ?CMP  ?   ?Component Value Date/Time  ? NA 141 02/27/2021 0809  ? K 5.1 02/27/2021 0809  ? CL 103 02/27/2021 0809  ? CO2 22 02/27/2021 0809  ? GLUCOSE 63 (L) 02/27/2021 0809  ? BUN 13 02/27/2021 0809  ? CREATININE 0.78 02/27/2021 0809  ? CALCIUM 9.6 02/27/2021 0809  ? PROT 7.0 02/27/2021 0809  ? ALBUMIN 4.3 02/27/2021 0809  ? AST 18 02/27/2021 0809  ? ALT 12 02/27/2021 0809  ? ALKPHOS 67 02/27/2021 0809  ? BILITOT 0.2 02/27/2021 0809  ? ? ? ?Diabetic Labs (most recent): ?Lab Results  ?Component Value Date  ? HGBA1C 4.5 (L) 02/27/2021  ? ? ?  ? ?Lab Results  ?Component Value  Date  ? TSH 2.670 02/27/2021  ?  ? ? ? ?Assessment & Plan:  ? ?1. Hyperinsulinemia ?2. Morbid obesity (HCC) ? ? ?- Susanna Benge  is being seen at a kind request of Early, 03/01/2021, NP. ?- I have reviewed her available  records and clinically evaluated the patient. ?- Based on these reviews, she has morbid obesity, hyponatremia, with possible episodes of reactive hypoglycemia. ?These conditions are interrelated  and point towards metabolic syndrome.  This patient is at risk for type 2 diabetes.  This risk is discussed in detail with her and ways to avoid it.  She is an ideal candidate for lifestyle medicine. ?- she acknowledges that there is a room for improvement in her food and drink choices. ?- Suggestion is made for her to avoid simple carbohydrates  from her diet including Cakes, Sweet Desserts, Ice Cream, Soda (diet and regular), Sweet Tea, Candies, Chips, Cookies, Store Bought Juices, Alcohol , Artificial Sweeteners,  Coffee Creamer, and "Sugar-free" Products, Lemonade. This will help patient to have more stable blood glucose profile and potentially avoid unintended weight gain. ? ?The following Lifestyle Medicine recommendations according to American College of Lifestyle Medicine  [facility]) were discussed and and offered to patient and she  agrees to start the journey:  ?A. Whole Foods, Plant-Based Nutrition comprising of fruits and vegetables, plant-based proteins, whole-grain carbohydrates was discussed in detail with the patient.   A list for source of those nutrients were also provided to the patient.  Patient will use only water or unsweetened tea for hydration. ?B.  The need to stay away from risky substances including alcohol, smoking; obtaining 7 to 9 hours of restorative sleep, at least 150 minutes of moderate intensity exercise weekly, the importance of healthy social connections,  and stress management techniques were discussed. ?C.  A full color page of  Calorie density of various food groups per pound  showing examples of each food groups was provided to the patient. ? ?She will not need medication at this time.  She will return with repeat labs for her insulin levels. ? ?- she is advised to maintain close follo

## 2021-04-28 NOTE — Patient Instructions (Signed)

## 2021-05-07 ENCOUNTER — Ambulatory Visit: Payer: Self-pay | Admitting: "Endocrinology

## 2021-05-21 ENCOUNTER — Ambulatory Visit (INDEPENDENT_AMBULATORY_CARE_PROVIDER_SITE_OTHER): Payer: No Typology Code available for payment source | Admitting: Obstetrics & Gynecology

## 2021-05-21 ENCOUNTER — Encounter (HOSPITAL_BASED_OUTPATIENT_CLINIC_OR_DEPARTMENT_OTHER): Payer: Self-pay | Admitting: Obstetrics & Gynecology

## 2021-05-21 VITALS — BP 118/55 | HR 76 | Ht 66.0 in | Wt 302.6 lb

## 2021-05-21 DIAGNOSIS — Z30432 Encounter for removal of intrauterine contraceptive device: Secondary | ICD-10-CM | POA: Diagnosis not present

## 2021-05-21 DIAGNOSIS — Z3202 Encounter for pregnancy test, result negative: Secondary | ICD-10-CM | POA: Diagnosis not present

## 2021-05-21 DIAGNOSIS — Z3042 Encounter for surveillance of injectable contraceptive: Secondary | ICD-10-CM

## 2021-05-21 DIAGNOSIS — T8332XA Displacement of intrauterine contraceptive device, initial encounter: Secondary | ICD-10-CM

## 2021-05-21 LAB — POCT URINE PREGNANCY: Preg Test, Ur: NEGATIVE

## 2021-05-21 MED ORDER — MEDROXYPROGESTERONE ACETATE 150 MG/ML IM SUSP
150.0000 mg | Freq: Once | INTRAMUSCULAR | Status: AC
  Administered 2021-05-21: 150 mg via INTRAMUSCULAR

## 2021-05-24 NOTE — Progress Notes (Signed)
GYNECOLOGY  VISIT  CC:   IUD check  HPI: 21 y.o. G0P0000 Single Black or [place] American female here for complaint of feeling IUD string.  Reports has been having some irregular bleeding as well.  Has a lot of cramping the last week.  She's felt the string and thinks it may be too low.  She has actually felt this on the side of the vagina.  If this is the case, she does want it removed.  Does want something for contraception as well.  Cannot take estrogen containing pills.  Would like to start today if possible.  Progesterone options discussed.  Pt would like to start Depo Provera if possible.  Side effects including irregular bleeding and weight gain discussed.    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 30 tablet 5   hydrOXYzine (ATARAX) 50 MG tablet Take 1 tablet (50 mg total) by mouth at bedtime for anxiety and sleep, may repeat dose one time if needed. 60 tablet 3   ibuprofen (ADVIL) 800 MG tablet Take 1 tablet (800 mg  total) by mouth every 8 (eight) hours as needed. 30 tablet 1   ondansetron (ZOFRAN) 8 MG tablet Take 1 tablet (8 mg total) by mouth every 8 (eight) hours as needed for nausea or vomiting. 30 tablet 2   topiramate (TOPAMAX) 50 MG tablet Take 1 tablet (50 mg total) by mouth 2 (two) times daily. 30 tablet 3   Vitamin D, Ergocalciferol, (DRISDOL) 1.25 MG (50000 UNIT) CAPS capsule Take 1 capsule (50,000 Units total) by mouth every 7 (seven) days. Take for 12 total doses(weeks) then can transition to 1000 units OTC supplement daily 12 capsule 0   No current facility-administered medications on file prior to visit.    ALLERGIES: Patient has no known allergies.  Family History  Problem Relation Age of Onset   Breast cancer Maternal Grandmother    Hypertension Mother    Obesity Mother    Miscarriages / [place] Other    Cancer Other     SH:  single, non smoker  Review of Systems  Genitourinary:        Irregular bleeding   PHYSICAL EXAMINATION:    BP (!) 118/55 (BP Location: Left Arm, Patient Position: Sitting, Cuff Size: Large)   Pulse 76   Ht 5\' 6"  (1.676 m) Comment: Reported  Wt (!) 302 lb 9.6 oz (137.3 kg)  BMI 48.84 kg/m     General appearance: alert, cooperative and appears stated age Lymph:  no inguinal LAD noted  Pelvic: External genitalia:  no lesions              Urethra:  normal appearing urethra with no masses, tenderness or lesions              Bartholins and Skenes: normal                 Vagina: normal appearing vagina with normal color and discharge, no lesions              Cervix: no lesions and IUD string noted but appears to be about 2cm in length              Bimanual Exam:  Uterus:  normal size, contour, position, consistency, mobility, non-tender              Bedside ultrasound performed.  IUD is in lower uterine segment.  Removal recommended.  Pt agreed.  Speculum placed.  IUD string grasped and removed intact with one pull.  Pt tolerated procedure well.     Chaperone, Octaviano Batty, CMA, was present for exam.  Assessment/Plan: 1. Malpositioned IUD, initial encounter - IUD removed without difficulty today.  Pt does not desire replacement.  2. Encounter for IUD removal  3. Encounter for surveillance of injectable contraceptive - medroxyPROGESTERone (DEPO-PROVERA) injection 150 mg.  Will repeat every 13 weeks x 1 year. - POCT urine pregnancy

## 2021-05-25 ENCOUNTER — Other Ambulatory Visit (HOSPITAL_BASED_OUTPATIENT_CLINIC_OR_DEPARTMENT_OTHER): Payer: Self-pay

## 2021-05-25 DIAGNOSIS — Z113 Encounter for screening for infections with a predominantly sexual mode of transmission: Secondary | ICD-10-CM

## 2021-05-26 LAB — HIV ANTIBODY (ROUTINE TESTING W REFLEX): HIV Screen 4th Generation wRfx: NONREACTIVE

## 2021-05-27 LAB — GC/CHLAMYDIA PROBE AMP
Chlamydia trachomatis, NAA: NEGATIVE
Neisseria Gonorrhoeae by PCR: NEGATIVE

## 2021-06-04 ENCOUNTER — Encounter (HOSPITAL_BASED_OUTPATIENT_CLINIC_OR_DEPARTMENT_OTHER): Payer: No Typology Code available for payment source | Admitting: Nurse Practitioner

## 2021-06-09 ENCOUNTER — Ambulatory Visit: Payer: No Typology Code available for payment source | Admitting: Physician Assistant

## 2021-06-09 ENCOUNTER — Other Ambulatory Visit (HOSPITAL_BASED_OUTPATIENT_CLINIC_OR_DEPARTMENT_OTHER): Payer: Self-pay

## 2021-06-09 ENCOUNTER — Encounter: Payer: Self-pay | Admitting: Physician Assistant

## 2021-06-09 VITALS — BP 116/74 | HR 92 | Temp 97.7°F | Ht 66.0 in | Wt 297.0 lb

## 2021-06-09 DIAGNOSIS — E161 Other hypoglycemia: Secondary | ICD-10-CM

## 2021-06-09 DIAGNOSIS — L940 Localized scleroderma [morphea]: Secondary | ICD-10-CM | POA: Diagnosis not present

## 2021-06-09 DIAGNOSIS — E160 Drug-induced hypoglycemia without coma: Secondary | ICD-10-CM

## 2021-06-09 DIAGNOSIS — G43719 Chronic migraine without aura, intractable, without status migrainosus: Secondary | ICD-10-CM

## 2021-06-09 DIAGNOSIS — F411 Generalized anxiety disorder: Secondary | ICD-10-CM

## 2021-06-09 DIAGNOSIS — D649 Anemia, unspecified: Secondary | ICD-10-CM

## 2021-06-09 DIAGNOSIS — Z8269 Family history of other diseases of the musculoskeletal system and connective tissue: Secondary | ICD-10-CM

## 2021-06-09 DIAGNOSIS — R6889 Other general symptoms and signs: Secondary | ICD-10-CM

## 2021-06-09 DIAGNOSIS — Z862 Personal history of diseases of the blood and blood-forming organs and certain disorders involving the immune mechanism: Secondary | ICD-10-CM

## 2021-06-09 DIAGNOSIS — T383X5A Adverse effect of insulin and oral hypoglycemic [antidiabetic] drugs, initial encounter: Secondary | ICD-10-CM

## 2021-06-09 DIAGNOSIS — G4700 Insomnia, unspecified: Secondary | ICD-10-CM

## 2021-06-09 MED ORDER — TRAZODONE HCL 50 MG PO TABS
25.0000 mg | ORAL_TABLET | Freq: Every evening | ORAL | 2 refills | Status: DC | PRN
  Filled 2021-06-09: qty 30, 30d supply, fill #0

## 2021-06-09 NOTE — Patient Instructions (Signed)
Anemia  Anemia is a condition in which there is not enough red blood cells or hemoglobin in the blood. Hemoglobin is a substance in red blood cells that carries oxygen. When you do not have enough red blood cells or hemoglobin (are anemic), your body cannot get enough oxygen and your organs may not work properly. As a result, you may feel very tired or have other problems. What are the causes? Common causes of anemia include: Excessive bleeding. Anemia can be caused by excessive bleeding inside or outside the body, including bleeding from the intestines or from heavy menstrual periods in females. Poor nutrition. Long-lasting (chronic) kidney, thyroid, and liver disease. Bone marrow disorders, spleen problems, and blood disorders. Cancer and treatments for cancer. HIV (human immunodeficiency virus) and AIDS (acquired immunodeficiency syndrome). Infections, medicines, and autoimmune disorders that destroy red blood cells. What are the signs or symptoms? Symptoms of this condition include: Minor weakness. Dizziness. Headache, or difficulties concentrating and sleeping. Heartbeats that feel irregular or faster than normal (palpitations). Shortness of breath, especially with exercise. Pale skin, lips, and nails, or cold hands and feet. Indigestion and nausea. Symptoms may occur suddenly or develop slowly. If your anemia is mild, you may not have symptoms. How is this diagnosed? This condition is diagnosed based on blood tests, your medical history, and a physical exam. In some cases, a test may be needed in which cells are removed from the soft tissue inside of a bone and looked at under a microscope (bone marrow biopsy). Your health care provider may also check your stool (feces) for blood and may do additional testing to look for the cause of your bleeding. Other tests may include: Imaging tests, such as a CT scan or MRI. A procedure to see inside your esophagus and stomach (endoscopy). A  procedure to see inside your colon and rectum (colonoscopy). How is this treated? Treatment for this condition depends on the cause. If you continue to lose a lot of blood, you may need to be treated at a hospital. Treatment may include: Taking supplements of iron, vitamin B12, or folic acid. Taking a hormone medicine (erythropoietin) that can help to stimulate red blood cell growth. Having a blood transfusion. This may be needed if you lose a lot of blood. Making changes to your diet. Having surgery to remove your spleen. Follow these instructions at home: Take over-the-counter and prescription medicines only as told by your health care provider. Take supplements only as told by your health care provider. Follow any diet instructions that you were given by your health care provider. Keep all follow-up visits as told by your health care provider. This is important. Contact a health care provider if: You develop new bleeding anywhere in the body. Get help right away if: You are very weak. You are short of breath. You have pain in your abdomen or chest. You are dizzy or feel faint. You have trouble concentrating. You have bloody stools, black stools, or tarry stools. You vomit repeatedly or you vomit up blood. These symptoms may represent a serious problem that is an emergency. Do not wait to see if the symptoms will go away. Get medical help right away. Call your local emergency services (911 in the [place] Do not drive yourself to the hospital. Summary Anemia is a condition in which you do not have enough red blood cells or enough of a substance in your red blood cells that carries oxygen (hemoglobin). Symptoms may occur suddenly or develop slowly. If your anemia   is mild, you may not have symptoms. This condition is diagnosed with blood tests, a medical history, and a physical exam. Other tests may be needed. Treatment for this condition depends on the cause of the anemia. This  information is not intended to replace advice given to you by your health care provider. Make sure you discuss any questions you have with your health care provider. Document Revised: 11/04/2020 Document Reviewed: 11/28/2018 Elsevier Patient Education  2023 [employer]  

## 2021-06-09 NOTE — Progress Notes (Unsigned)
New Patient Office Visit  Subjective    Patient ID: Tammy [name], female    DOB: 10-09-2000  Age: 21 y.o. MRN: [id number]  CC:  Chief Complaint  Patient presents with   New Patient (Initial Visit)    HPI Aarvi Stotts presents to establish care. Patient has a past medical history of anemia and hypoglycemia. Reports was previously established with endocrinology and requesting new referral. States in the past has needed iron infusions. States has started to feel cold again. Patient reports a history of heavy, painful and irregular periods. Is followed by OB/GYN.  States tired IUD but it was unsuccessful and it was removed May 18th and was placed on depo-provera injection. Patient relocated from [place] and reports was diagnosed with lymphedema and morphea scleroderma about 2 years which was treated with methotrexate and other therapies. Also reports family history of fibromyalgia and has noticed becoming more sensitive with touch. Inquiring about rheumatology referral. Patient has a hx of migraine and states was started on Topamax but currently not taking medication. Was also given a rx for Nurtec which she did not start because of dissolvable tablet form. Has been experiencing a headache everyday. Patient was started on Lexapro and Hydroxyzine to help with anxiety and sleep. Reports does not take medications daily. Also states does not like how hydroxyzine makes her feel. In the past has tried melatonin for sleep which has been ineffective.    Outpatient Encounter Medications as of 06/09/2021  Medication Sig   ibuprofen (ADVIL) 800 MG tablet Take 1 tablet (800 mg total) by mouth every 8 (eight) hours as needed.   Rimegepant Sulfate (NURTEC) 75 MG TBDP Take 1 tablet by mouth daily as needed.   traZODone (DESYREL) 50 MG tablet Take 0.5-1 tablets (25-50 mg total) by mouth at bedtime as needed for sleep.   Vitamin D, Ergocalciferol, (DRISDOL) 1.25 MG (50000 UNIT) CAPS capsule Take 1 capsule (50,000 Units  total) by mouth every 7 (seven) days. Take for 12 total doses(weeks) then can transition to 1000 units OTC supplement daily   [DISCONTINUED] escitalopram (LEXAPRO) 10 MG tablet Take 1/2 tab by mouth at bedtime for 4 days then increase to 1 tab by mouth at bedtime daily.   [DISCONTINUED] hydrOXYzine (ATARAX) 50 MG tablet Take 1 tablet (50 mg total) by mouth at bedtime for anxiety and sleep, may repeat dose one time if needed.   [DISCONTINUED] topiramate (TOPAMAX) 50 MG tablet Take 1 tablet (50 mg total) by mouth 2 (two) times daily.   [DISCONTINUED] ondansetron (ZOFRAN) 8 MG tablet Take 1 tablet (8 mg total) by mouth every 8 (eight) hours as needed for nausea or vomiting.   No facility-administered encounter medications on file as of 06/09/2021.    Past Medical History:  Diagnosis Date   Anemia    Anxiety disorder 03/25/2021   History of anemia    h/o iron infusions x 2   Localized scleroderma (morphea)    Lymphedema    Migraines     Past Surgical History:  Procedure Laterality Date   TONSILLECTOMY      Family History  Problem Relation Age of Onset   Breast cancer Maternal Grandmother    Hypertension Mother    Obesity Mother    Miscarriages / [place] Other    Cancer Other     Social History   Socioeconomic History   Marital status: Single    Spouse name: Not on file   Number of children: Not on file   Years of  education: Not on file   Highest education level: Not on file  Occupational History   Not on file  Tobacco Use   Smoking status: Never   Smokeless tobacco: Never  Vaping Use   Vaping Use: Never used  Substance and Sexual Activity   Alcohol use: Never   Drug use: Never   Sexual activity: Not Currently    Birth control/protection: Abstinence  Other Topics Concern   Not on file  Social History Narrative   Not on file   Social Determinants of Health   Financial Resource Strain: Not on file  Food Insecurity: Not on file  Transportation Needs: Not on  file  Physical Activity: Not on file  Stress: Not on file  Social Connections: Not on file  Intimate Partner Violence: Not on file      06/09/2021    1:40 PM 05/21/2021    8:18 AM 04/21/2021    5:17 PM 02/27/2021    2:43 PM 02/09/2021    1:45 PM  Depression screen PHQ 2/9  Decreased Interest 0 0 0 0 0  Down, Depressed, Hopeless 0 0 0 1 0  PHQ - 2 Score 0 0 0 1 0  Altered sleeping 1   2   Tired, decreased energy 1   1   Change in appetite 2   1   Feeling bad or failure about yourself  0   0   Trouble concentrating 0   1   Moving slowly or fidgety/restless 0   0   Suicidal thoughts 0   0   PHQ-9 Score 4   6   Difficult doing work/chores Somewhat difficult   Not difficult at all       06/09/2021    1:40 PM  GAD 7 : Generalized Anxiety Score  Nervous, Anxious, on Edge 1  Control/stop worrying 1  Worry too much - different things 0  Trouble relaxing 0  Restless 0  Easily annoyed or irritable 1  Afraid - awful might happen 0  Total GAD 7 Score 3  Anxiety Difficulty Somewhat difficult      ROS Review of Systems:  A fourteen system review of systems was performed and found to be positive as per HPI.   Objective    BP 116/74   Pulse 92   Temp 97.7 F (36.5 C)   Ht _0  (1.676 m)   Wt 297 lb (134.7 kg)   LMP  (LMP Unknown)   SpO2 99%   BMI 47.94 kg/m   Physical Exam General:  Pleasant and cooperative, appropriate for stated age.  Neuro:  Alert and oriented,  extra-ocular muscles intact  HEENT:  Normocephalic, atraumatic, neck supple  Skin:  no gross rash, warm, pink. Cardiac:  RRR, S1 S2 Respiratory: CTA B/L  Vascular:  Ext warm, no cyanosis apprec.; cap RF less 2 sec. Psych:  No HI/SI, judgement and insight good, Euthymic mood. Full Affect.   Assessment & Plan:   Problem List Items Addressed This Visit       Cardiovascular and Mediastinum   Migraine    -Discussed with patient discontinuing Topiramate and starting Nurtec 75 mg every other day for at least  4-6 weeks for preventative therapy. Pt verbalized understanding and agreeable. Reports has a 90 day supply at home, does not need a rx at this time. Will reassess medication therapy at follow-up visit.       Relevant Medications   traZODone (DESYREL) 50 MG tablet   Rimegepant Sulfate (  NURTEC) 75 MG TBDP     Digestive   Hyperinsulinemia    -Reviewed lab from 03/2021 with free insulin elevated at 73. Will place new endocrinology referral.        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 Will place new endocrinology referral.        Relevant Orders   Ambulatory referral to Endocrinology   Comp Met (CMET) (Completed)     Musculoskeletal and Integument   Morphea scleroderma    -Will collect inflammation markers and ANA. Will place rheumatology referral. Will request rheumatology records from [place].       Relevant Orders   ANA w/Reflex if Positive (Completed)   Sedimentation rate (Completed)   C-reactive protein (Completed)   Ambulatory referral to Rheumatology     Other   History of iron deficiency anemia    -Patient feeling symptomatic with cold intolerance. Will collect CBC w/d and iron studies. Reports hx of iron infusions so will place hematology referral.        Relevant Orders   Ambulatory referral to Hematology / Oncology   Anxiety disorder    -PHQ-9 score of 4 and GAD-7 score of 3, low. Discussed with patient stopping Lexapro and Hydroxyzine at this time and will continue to manage with non-pharmacologic therapy. If anxiety becomes more frequent or changes from baseline then recommend restarting SSRI therapy or trial SNRI. Cymbalta likely a good alternative especially if there are signs of underlying fibromyalgia. Will await rheumatology consult. Will continue to monitor and reassess mood at follow-up visit.        Relevant Medications   traZODone (DESYREL) 50 MG tablet   Morbid obesity ([place])    -Patient reports working on weight loss but has been challenging. Discussed referral to [facility] is a reasonable approach. Will place referral.        Relevant Orders   Amb Ref to Medical Weight Management   Other Visit Diagnoses     Anemia, unspecified type       Relevant Orders   Ambulatory referral to Hematology / Oncology   CBC w/Diff (Completed)   Iron, TIBC and Ferritin Panel (Completed)   Cold intolerance       Relevant Orders   CBC w/Diff (Completed)   Iron, TIBC and Ferritin Panel (Completed)   TSH (Completed)   Insomnia, unspecified type       Relevant Medications   traZODone (DESYREL) 50 MG tablet   Family history of fibromyalgia       Relevant Orders   Ambulatory referral to Rheumatology      Insomnia: -Discussed with patient alternative management options and will trial Trazodone 25-50 mg. Will reassess medication therapy at f/up visit.   Cold intolerance: -Will place lab orders to evaluate for anemia, metabolic or endocrine etiology.   Return in about 2 months (around 08/09/2021) for mood, insomnia.   Lorrene Reid, PA-C

## 2021-06-10 ENCOUNTER — Encounter: Payer: Self-pay | Admitting: Physician Assistant

## 2021-06-10 ENCOUNTER — Telehealth: Payer: Self-pay | Admitting: Oncology

## 2021-06-10 LAB — CBC WITH DIFFERENTIAL/PLATELET
Basophils Absolute: 0 10*3/uL (ref 0.0–0.2)
Basos: 0 %
EOS (ABSOLUTE): 0.1 10*3/uL (ref 0.0–0.4)
Eos: 1 %
Hematocrit: 35.2 % (ref 34.0–46.6)
Hemoglobin: 11.4 g/dL (ref 11.1–15.9)
Immature Grans (Abs): 0 10*3/uL (ref 0.0–0.1)
Immature Granulocytes: 0 %
Lymphocytes Absolute: 3.1 10*3/uL (ref 0.7–3.1)
Lymphs: 41 %
MCH: 29.2 pg (ref 26.6–33.0)
MCHC: 32.4 g/dL (ref 31.5–35.7)
MCV: 90 fL (ref 79–97)
Monocytes Absolute: 0.4 10*3/uL (ref 0.1–0.9)
Monocytes: 6 %
Neutrophils Absolute: 3.9 10*3/uL (ref 1.4–7.0)
Neutrophils: 52 %
Platelets: 471 10*3/uL — ABNORMAL HIGH (ref 150–450)
RBC: 3.91 x10E6/uL (ref 3.77–5.28)
RDW: 12.1 % (ref 11.7–15.4)
WBC: 7.6 10*3/uL (ref 3.4–10.8)

## 2021-06-10 LAB — HEMOGLOBIN A1C
Est. average glucose Bld gHb Est-mCnc: 82 mg/dL
Hgb A1c MFr Bld: 4.5 % — ABNORMAL LOW (ref 4.8–5.6)

## 2021-06-10 LAB — COMPREHENSIVE METABOLIC PANEL
ALT: 12 IU/L (ref 0–32)
AST: 13 IU/L (ref 0–40)
Albumin/Globulin Ratio: 1.6 (ref 1.2–2.2)
Albumin: 4.3 g/dL (ref 3.9–5.0)
Alkaline Phosphatase: 70 IU/L (ref 44–121)
BUN/Creatinine Ratio: 12 (ref 9–23)
BUN: 11 mg/dL (ref 6–20)
Bilirubin Total: 0.5 mg/dL (ref 0.0–1.2)
CO2: 23 mmol/L (ref 20–29)
Calcium: 9.5 mg/dL (ref 8.7–10.2)
Chloride: 106 mmol/L (ref 96–106)
Creatinine, Ser: 0.9 mg/dL (ref 0.57–1.00)
Globulin, Total: 2.7 g/dL (ref 1.5–4.5)
Glucose: 90 mg/dL (ref 70–99)
Potassium: 4 mmol/L (ref 3.5–5.2)
Sodium: 142 mmol/L (ref 134–144)
Total Protein: 7 g/dL (ref 6.0–8.5)
eGFR: 93 mL/min/{1.73_m2} (ref >59)

## 2021-06-10 LAB — ANA W/REFLEX IF POSITIVE: Anti Nuclear Antibody (ANA): NEGATIVE

## 2021-06-10 LAB — IRON,TIBC AND FERRITIN PANEL
Ferritin: 133 ng/mL (ref 15–150)
Iron Saturation: 10 % — ABNORMAL LOW (ref 15–55)
Iron: 31 ug/dL (ref 27–159)
Total Iron Binding Capacity: 310 ug/dL (ref 250–450)
UIBC: 279 ug/dL (ref 131–425)

## 2021-06-10 LAB — TSH: TSH: 2.14 u[IU]/mL (ref 0.450–4.500)

## 2021-06-10 LAB — C-REACTIVE PROTEIN: CRP: 22 mg/L — ABNORMAL HIGH (ref 0–10)

## 2021-06-10 LAB — SEDIMENTATION RATE: Sed Rate: 49 mm/hr — ABNORMAL HIGH (ref 0–32)

## 2021-06-10 MED ORDER — NURTEC 75 MG PO TBDP: 1.0000 | ORAL_TABLET | Freq: Every day | ORAL | 0 refills | Status: DC | PRN

## 2021-06-10 NOTE — Assessment & Plan Note (Signed)
-  Will collect inflammation markers and ANA. Will place rheumatology referral. Will request rheumatology records from [place].

## 2021-06-10 NOTE — Assessment & Plan Note (Signed)
-  Patient feeling symptomatic with cold intolerance. Will collect CBC w/d and iron studies. Reports hx of iron infusions so will place hematology referral.

## 2021-06-10 NOTE — Assessment & Plan Note (Addendum)
-  PHQ-9 score of 4 and GAD-7 score of 3, low. Discussed with patient stopping Lexapro and Hydroxyzine at this time and will continue to manage with non-pharmacologic therapy. If anxiety becomes more frequent or changes from baseline then recommend restarting SSRI therapy or trial SNRI. Cymbalta likely a good alternative especially if there are signs of underlying fibromyalgia. Will await rheumatology consult. Will continue to monitor and reassess mood at follow-up visit.

## 2021-06-10 NOTE — Assessment & Plan Note (Signed)
-  Patient reports working on weight loss but has been challenging. Discussed referral to [facility] is a reasonable approach. Will place referral.

## 2021-06-10 NOTE — Assessment & Plan Note (Signed)
-  Last A1c 4.5, will repeat A1c. Will place new endocrinology referral.

## 2021-06-10 NOTE — Telephone Encounter (Signed)
Called 1X, attempted to contact patient in regards to referral, no answer so voicemail was left for patient to call back  

## 2021-06-10 NOTE — Assessment & Plan Note (Addendum)
-  Reviewed lab from 03/2021 with free insulin elevated at 73. Will place new endocrinology referral.

## 2021-06-10 NOTE — Assessment & Plan Note (Signed)
-  Discussed with patient discontinuing Topiramate and starting Nurtec 75 mg every other day for at least 4-6 weeks for preventative therapy. Pt verbalized understanding and agreeable. Reports has a 90 day supply at home, does not need a rx at this time. Will reassess medication therapy at follow-up visit.

## 2021-06-11 ENCOUNTER — Telehealth: Payer: Self-pay | Admitting: Hematology and Oncology

## 2021-06-11 NOTE — Telephone Encounter (Signed)
Scheduled new hem appt per 6/8 secure chat with Launa Grill at [facility]. She sent over pt's referral, requesting for [place] to see her for an earlier appt due to urgency of referral. She informed me she spoke to pt already and gave her the appt details.

## 2021-06-12 ENCOUNTER — Ambulatory Visit: Payer: No Typology Code available for payment source | Admitting: Physician Assistant

## 2021-06-15 ENCOUNTER — Ambulatory Visit (HOSPITAL_BASED_OUTPATIENT_CLINIC_OR_DEPARTMENT_OTHER): Payer: No Typology Code available for payment source | Admitting: Obstetrics & Gynecology

## 2021-06-19 ENCOUNTER — Encounter: Payer: Self-pay | Admitting: Hematology and Oncology

## 2021-06-19 ENCOUNTER — Inpatient Hospital Stay: Payer: BLUE CROSS/BLUE SHIELD | Attending: Hematology and Oncology | Admitting: Hematology and Oncology

## 2021-06-19 ENCOUNTER — Other Ambulatory Visit: Payer: Self-pay

## 2021-06-19 DIAGNOSIS — D508 Other iron deficiency anemias: Secondary | ICD-10-CM

## 2021-06-19 DIAGNOSIS — Z8742 Personal history of other diseases of the female genital tract: Secondary | ICD-10-CM

## 2021-06-19 DIAGNOSIS — E282 Polycystic ovarian syndrome: Secondary | ICD-10-CM | POA: Diagnosis not present

## 2021-06-19 DIAGNOSIS — D509 Iron deficiency anemia, unspecified: Secondary | ICD-10-CM | POA: Insufficient documentation

## 2021-06-19 DIAGNOSIS — D75838 Other thrombocytosis: Secondary | ICD-10-CM | POA: Diagnosis not present

## 2021-06-20 ENCOUNTER — Encounter: Payer: Self-pay | Admitting: Hematology and Oncology

## 2021-06-20 DIAGNOSIS — Z8742 Personal history of other diseases of the female genital tract: Secondary | ICD-10-CM | POA: Insufficient documentation

## 2021-06-20 DIAGNOSIS — D75838 Other thrombocytosis: Secondary | ICD-10-CM | POA: Insufficient documentation

## 2021-06-20 NOTE — Assessment & Plan Note (Signed)
Since her recent Depo injection, she have no further menstruation I will defer to her gynecologist and primary doctor for management

## 2021-06-20 NOTE — Progress Notes (Signed)
[facility] CONSULT NOTE  Patient Care Team: Mayer Masker, PA-C as PCP - General (Physician Assistant)  ASSESSMENT & PLAN:  Iron deficiency anemia I reviewed her recent blood work She is only borderline anemic Her iron studies are low but with recent cessation of menstruation, I anticipate this will resolve in time The patient has poor venous access I am concerned about risk of reaction to IV iron in the setting of poor IV access We discussed risk and benefits of oral iron treatment Ultimately, the patient is comfortable not to pursue IV iron infusion She will try to take over-the-counter oral iron supplement 3-5 times a week to avoid side effects such as constipation We discussed future follow-up She is comfortable having iron studies recheck with her primary care doctor in 3 to 6 months If she has severe and recurrent iron deficiency anemia in the future, I will bring her back to receive intravenous iron infusion  History of menorrhagia Since her recent Depo injection, she have no further menstruation I will defer to her gynecologist and primary doctor for management  Reactive thrombocytosis This is due to relative iron deficiency She does not need further follow-up This should resolve with resolution of iron deficiency No orders of the defined types were placed in this encounter.   All questions were answered. The patient knows to call the clinic with any problems, questions or concerns.  The total time spent in the appointment was 55 minutes encounter with patients including review of chart and various tests results, discussions about plan of care and coordination of care plan  Artis Delay, MD 6/17/202311:14 AM   CHIEF COMPLAINTS/PURPOSE OF CONSULTATION:  Anemia  HISTORY OF PRESENTING ILLNESS:  Tammy [name] 21 y.o. female is here because of anemia, of iron deficiency  She was found to have abnormal CBC from recent blood work On June 10, 2011, her CBC  showed hemoglobin of 11.4, platelet count of 471, iron saturation of 10% ferritin of 133 Patient is here accompanied by her mother She used to live in [place] and had received intravenous iron infusion last September She is subsequently relocated here to [facility] and will stay here long-term She have history of fatigue She has a history of morphea/scleroderma and was placed on steroids and methotrexate treatment for several years and now in remission  She denies recent chest pain on exertion, shortness of breath on minimal exertion, pre-syncopal episodes, or palpitations. She had not noticed any recent bleeding such as epistaxis, hematuria or hematochezia The patient denies over the counter NSAID ingestion. She is not on antiplatelets agents.  She had no prior history or diagnosis of cancer. Her age appropriate screening programs are up-to-date. She denies any pica (she had history of pica) and eats a variety of diet. She never donated blood or received blood transfusion The patient is not taking oral iron supplement.  She was on oral iron supplement in the past that caused occasional constipation The patient have history of irregular menstruation and heavy menstruation last year.  She was diagnosed with PCOS She was started on Depo injection recently that has caused cessation of menses  MEDICAL HISTORY:  Past Medical History:  Diagnosis Date   Anemia    Anxiety disorder 03/25/2021   History of anemia    h/o iron infusions x 2   Localized scleroderma (morphea)    Lymphedema    Migraines     SURGICAL HISTORY: Past Surgical History:  Procedure Laterality Date   TONSILLECTOMY  SOCIAL HISTORY: Social History   Socioeconomic History   Marital status: Single    Spouse name: Not on file   Number of children: Not on file   Years of education: Not on file   Highest education level: Not on file  Occupational History   Not on file  Tobacco Use   Smoking status: Never    Smokeless tobacco: Never  Vaping Use   Vaping Use: Never used  Substance and Sexual Activity   Alcohol use: Never   Drug use: Never   Sexual activity: Not Currently    Birth control/protection: Abstinence  Other Topics Concern   Not on file  Social History Narrative   Not on file   Social Determinants of Health   Financial Resource Strain: Not on file  Food Insecurity: Not on file  Transportation Needs: Not on file  Physical Activity: Not on file  Stress: Not on file  Social Connections: Not on file  Intimate Partner Violence: Not on file    FAMILY HISTORY: Family History  Problem Relation Age of Onset   Breast cancer Maternal Grandmother    Hypertension Mother    Obesity Mother    Miscarriages / Stillbirths Other    Cancer Other     ALLERGIES:  has No Known Allergies.  MEDICATIONS:  Current Outpatient Medications  Medication Sig Dispense Refill   ibuprofen (ADVIL) 800 MG tablet Take 1 tablet (800 mg total) by mouth every 8 (eight) hours as needed. 30 tablet 1   Rimegepant Sulfate (NURTEC) 75 MG TBDP Take 1 tablet by mouth daily as needed. 30 tablet 0   traZODone (DESYREL) 50 MG tablet Take 0.5-1 tablets (25-50 mg total) by mouth at bedtime as needed for sleep. 30 tablet 2   Vitamin D, Ergocalciferol, (DRISDOL) 1.25 MG (50000 UNIT) CAPS capsule Take 1 capsule (50,000 Units total) by mouth every 7 (seven) days. Take for 12 total doses(weeks) then can transition to 1000 units OTC supplement daily 12 capsule 0   No current facility-administered medications for this visit.    REVIEW OF SYSTEMS:   Constitutional: Denies fevers, chills or abnormal night sweats Eyes: Denies blurriness of vision, double vision or watery eyes Ears, nose, mouth, throat, and face: Denies mucositis or sore throat Respiratory: Denies cough, dyspnea or wheezes Cardiovascular: Denies palpitation, chest discomfort or lower extremity swelling Gastrointestinal:  Denies nausea, heartburn or change  in bowel habits Skin: Denies abnormal skin rashes Lymphatics: Denies new lymphadenopathy or easy bruising Neurological:Denies numbness, tingling or new weaknesses Behavioral/Psych: Mood is stable, no new changes  All other systems were reviewed with the patient and are negative.  PHYSICAL EXAMINATION: ECOG PERFORMANCE STATUS: 0 - Asymptomatic  Vitals:   06/19/21 1414  BP: 123/80  Pulse: 99  Resp: 18  Temp: 99 F (37.2 C)  SpO2: 100%   Filed Weights   06/19/21 1414  Weight: (!) 300 lb 6.4 oz (136.3 kg)    GENERAL:alert, no distress and comfortable SKIN: skin color, texture, turgor are normal, no rashes or significant lesions EYES: normal, conjunctiva are pink and non-injected, sclera clear OROPHARYNX:no exudate, no erythema and lips, buccal mucosa, and tongue normal  NECK: supple, thyroid normal size, non-tender, without nodularity LYMPH:  no palpable lymphadenopathy in the cervical, axillary or inguinal LUNGS: clear to auscultation and percussion with normal breathing effort HEART: regular rate & rhythm and no murmurs and no lower extremity edema ABDOMEN:abdomen soft, non-tender and normal bowel sounds Musculoskeletal:no cyanosis of digits and no clubbing  PSYCH: alert &  oriented x 3 with fluent speech NEURO: no focal motor/sensory deficits

## 2021-06-20 NOTE — Assessment & Plan Note (Signed)
I reviewed her recent blood work She is only borderline anemic Her iron studies are low but with recent cessation of menstruation, I anticipate this will resolve in time The patient has poor venous access I am concerned about risk of reaction to IV iron in the setting of poor IV access We discussed risk and benefits of oral iron treatment Ultimately, the patient is comfortable not to pursue IV iron infusion She will try to take over-the-counter oral iron supplement 3-5 times a week to avoid side effects such as constipation We discussed future follow-up She is comfortable having iron studies recheck with her primary care doctor in 3 to 6 months If she has severe and recurrent iron deficiency anemia in the future, I will bring her back to receive intravenous iron infusion

## 2021-06-20 NOTE — Assessment & Plan Note (Signed)
This is due to relative iron deficiency She does not need further follow-up This should resolve with resolution of iron deficiency

## 2021-07-03 ENCOUNTER — Inpatient Hospital Stay: Payer: Self-pay

## 2021-07-03 ENCOUNTER — Encounter: Payer: Self-pay | Admitting: Physician Assistant

## 2021-07-03 ENCOUNTER — Encounter: Payer: No Typology Code available for payment source | Admitting: Nurse Practitioner

## 2021-07-16 ENCOUNTER — Ambulatory Visit: Payer: No Typology Code available for payment source | Admitting: Nurse Practitioner

## 2021-07-27 ENCOUNTER — Ambulatory Visit: Payer: No Typology Code available for payment source | Admitting: Medical-Surgical

## 2021-08-05 DIAGNOSIS — Z0289 Encounter for other administrative examinations: Secondary | ICD-10-CM

## 2021-08-06 ENCOUNTER — Ambulatory Visit (INDEPENDENT_AMBULATORY_CARE_PROVIDER_SITE_OTHER): Payer: No Typology Code available for payment source

## 2021-08-06 DIAGNOSIS — Z3042 Encounter for surveillance of injectable contraceptive: Secondary | ICD-10-CM | POA: Diagnosis not present

## 2021-08-06 MED ORDER — MEDROXYPROGESTERONE ACETATE 150 MG/ML IM SUSP
150.0000 mg | Freq: Once | INTRAMUSCULAR | Status: AC
  Administered 2021-08-06: 150 mg via INTRAMUSCULAR

## 2021-08-06 NOTE — Progress Notes (Signed)
Patient came in today to receive MedroxyProgesterone Acetate 150mg . Injection was given in the right glut. Med. Patient tolerated injection well. Patient was informed that next injection can be given as early as 10/22/2021 but no later than 11/05/2021. tbw

## 2021-08-10 ENCOUNTER — Encounter: Payer: Self-pay | Admitting: Physician Assistant

## 2021-08-10 ENCOUNTER — Ambulatory Visit (INDEPENDENT_AMBULATORY_CARE_PROVIDER_SITE_OTHER): Payer: No Typology Code available for payment source | Admitting: Physician Assistant

## 2021-08-10 VITALS — BP 118/81 | HR 84 | Temp 97.7°F | Ht 66.0 in | Wt 304.0 lb

## 2021-08-10 DIAGNOSIS — G4700 Insomnia, unspecified: Secondary | ICD-10-CM

## 2021-08-10 DIAGNOSIS — F411 Generalized anxiety disorder: Secondary | ICD-10-CM

## 2021-08-10 DIAGNOSIS — R200 Anesthesia of skin: Secondary | ICD-10-CM | POA: Diagnosis not present

## 2021-08-10 DIAGNOSIS — G43719 Chronic migraine without aura, intractable, without status migrainosus: Secondary | ICD-10-CM | POA: Diagnosis not present

## 2021-08-10 DIAGNOSIS — N62 Hypertrophy of breast: Secondary | ICD-10-CM

## 2021-08-10 DIAGNOSIS — E160 Drug-induced hypoglycemia without coma: Secondary | ICD-10-CM

## 2021-08-10 DIAGNOSIS — T383X5A Adverse effect of insulin and oral hypoglycemic [antidiabetic] drugs, initial encounter: Secondary | ICD-10-CM

## 2021-08-10 MED ORDER — MEDROXYPROGESTERONE ACETATE 150 MG/ML IM SUSP: 150.0000 mg | INTRAMUSCULAR | Status: DC

## 2021-08-10 NOTE — Progress Notes (Addendum)
Established patient visit   Patient: Tammy [name]   DOB: 08-Dec-2000   21 y.o. Female  MRN: [id number] Visit Date: 08/10/2021  Chief Complaint  Patient presents with   Follow-up   Subjective    HPI  Patient presents for follow-up on mood, insomnia and migraine. Patient is currently enrolled at pharmacy tech program. States has been writing a lot more and has noticed increased right hand numbness. In the past has worse a wrist splint which helped some. Also right groin pain she had resolved after 2 weeks. Patient also complains of neck and back pain secondary to large breasts. Working on weight loss, has an upcoming appointment with medical weight clinic.  Right hand numbness- + Phalen, + Tinel's sign Episodes of sugar dropping and concerned with long distance driving   Mood: States has been feeling better. Not currently on medication therapy.   Insomnia: Patient reports has not needed to try trazodone for sleep yet.  Migraine: States her headaches are good when she remembers to take Nurtec.      08/10/2021    3:44 PM 06/09/2021    1:40 PM 05/21/2021    8:18 AM 04/21/2021    5:17 PM 02/27/2021    2:43 PM  Depression screen PHQ 2/9  Decreased Interest 0 0 0 0 0  Down, Depressed, Hopeless 0 0 0 0 1  PHQ - 2 Score 0 0 0 0 1  Altered sleeping 0 1   2  Tired, decreased energy _0 Change in appetite _1 Feeling bad or failure about yourself  0 0   0  Trouble concentrating 0 0   1  Moving slowly or fidgety/restless 0 0   0  Suicidal thoughts 0 0   0  PHQ-9 Score _2 Difficult doing work/chores Not difficult at all Somewhat difficult   Not difficult at all      08/10/2021    3:45 PM 06/09/2021    1:40 PM  GAD 7 : Generalized Anxiety Score  Nervous, Anxious, on Edge 0 1  Control/stop worrying 0 1  Worry too much - different things 0 0  Trouble relaxing 0 0  Restless 0 0  Easily annoyed or irritable 1 1  Afraid - awful might happen 0 0  Total GAD 7 Score 1 3   Anxiety Difficulty Not difficult at all Somewhat difficult        Medications: Outpatient Medications Prior to Visit  Medication Sig   ibuprofen (ADVIL) 800 MG tablet Take 1 tablet (800 mg total) by mouth every 8 (eight) hours as needed.   Rimegepant Sulfate (NURTEC) 75 MG TBDP Take 1 tablet by mouth daily as needed.   traZODone (DESYREL) 50 MG tablet Take 0.5-1 tablets (25-50 mg total) by mouth at bedtime as needed for sleep.   Vitamin D, Ergocalciferol, (DRISDOL) 1.25 MG (50000 UNIT) CAPS capsule Take 1 capsule (50,000 Units total) by mouth every 7 (seven) days. Take for 12 total doses(weeks) then can transition to 1000 units OTC supplement daily   No facility-administered medications prior to visit.    Review of Systems Review of Systems:  A fourteen system review of systems was performed and found to be positive as per HPI.  Last CBC Lab Results  Component Value Date   WBC 7.6 06/09/2021   HGB 11.4 06/09/2021   HCT 35.2 06/09/2021   MCV 90 06/09/2021   MCH 29.2 06/09/2021  RDW 12.1 06/09/2021   PLT 471 (H) 98/10/2546   Last metabolic panel Lab Results  Component Value Date   GLUCOSE 90 06/09/2021   NA 142 06/09/2021   K 4.0 06/09/2021   CL 106 06/09/2021   CO2 23 06/09/2021   BUN 11 06/09/2021   CREATININE 0.90 06/09/2021   EGFR 93 06/09/2021   CALCIUM 9.5 06/09/2021   PROT 7.0 06/09/2021   ALBUMIN 4.3 06/09/2021   LABGLOB 2.7 06/09/2021   AGRATIO 1.6 06/09/2021   BILITOT 0.5 06/09/2021   ALKPHOS 70 06/09/2021   AST 13 06/09/2021   ALT 12 06/09/2021   Last lipids No results found for: "CHOL", "HDL", "LDLCALC", "LDLDIRECT", "TRIG", "CHOLHDL" Last hemoglobin A1c Lab Results  Component Value Date   HGBA1C 4.5 (L) 06/09/2021   Last thyroid functions Lab Results  Component Value Date   TSH 2.140 06/09/2021     Objective    BP 118/81   Pulse 84   Temp 97.7 F (36.5 C) (Temporal)   Ht _0  (1.676 m)   Wt (!) 304 lb (137.9 kg)   BMI 49.07 kg/m   BP Readings from Last 3 Encounters:  08/10/21 118/81  06/19/21 123/80  06/09/21 116/74   Wt Readings from Last 3 Encounters:  08/10/21 (!) 304 lb (137.9 kg)  06/19/21 (!) 300 lb 6.4 oz (136.3 kg)  06/09/21 297 lb (134.7 kg)    Physical Exam  General:  Pleasant and cooperative, appropriate for stated age.  Neuro:  Alert and oriented,  extra-ocular muscles intact  HEENT:  Normocephalic, atraumatic, neck supple  Skin:  no gross rash, warm, pink. Cardiac:  RRR, S1 S2 Respiratory: CTA B/L  Vascular:  Ext warm, no cyanosis apprec.; cap RF less 2 sec. Psych:  No HI/SI, judgement and insight good, Euthymic mood. Full Affect.   No results found for any visits on 08/10/21.  Assessment & Plan      Problem List Items Addressed This Visit       Cardiovascular and Mediastinum   Migraine - Primary     Other   Anxiety disorder   Other Visit Diagnoses     Insomnia, unspecified type       Numbness of right hand           Return in about 4 months (around 12/10/2021) for mood, insomnia, migraine.        Lorrene Reid, PA-C  [facility] at [facility] [phone] (phone) [phone] (fax)  [place]

## 2021-08-10 NOTE — Patient Instructions (Signed)
Carpal Tunnel Syndrome  Carpal tunnel syndrome is a condition that causes pain, weakness, and numbness in your hand and arm. Numbness is when you cannot feel an area in your body. The carpal tunnel is a narrow area that is on the palm side of your wrist. Repeated wrist motion or certain diseases may cause swelling in the tunnel. This swelling can pinch the main nerve in the wrist. This nerve is called the median nerve. What are the causes? This condition may be caused by: Moving your hand and wrist over and over again while doing a task. Injury to the wrist. Arthritis. A sac of fluid (cyst) or abnormal growth (tumor) in the carpal tunnel. Fluid buildup during pregnancy. Use of tools that vibrate. Sometimes the cause is not known. What increases the risk? The following factors may make you more likely to have this condition: Having a job that makes you do these things: Move your hand over and over again. Work with tools that vibrate, such as drills or sanders. Being a woman. Having diabetes, obesity, thyroid problems, or kidney failure. What are the signs or symptoms? Symptoms of this condition include: A tingling feeling in your fingers. Tingling or loss of feeling in your hand. Pain in your entire arm. This pain may get worse when you bend your wrist and elbow for a long time. Pain in your wrist that goes up your arm to your shoulder. Pain that goes down into your palm or fingers. Weakness in your hands. You may find it hard to grab and hold items. You may feel worse at night. How is this treated? This condition may be treated with: Lifestyle changes. You will be asked to stop or change the activity that caused your problem. Doing exercises and activities that make bones, muscles, and tendons stronger (physical therapy). Learning how to use your hand again (occupational therapy). Medicines for pain and swelling. You may have injections in your wrist. A wrist splint or  brace. Surgery. Follow these instructions at home: If you have a splint or brace: Wear the splint or brace as told by your doctor. Take it off only as told by your doctor. Loosen the splint if your fingers: Tingle. Become numb. Turn cold and blue. Keep the splint or brace clean. If the splint or brace is not waterproof: Do not let it get wet. Cover it with a watertight covering when you take a bath or a shower. Managing pain, stiffness, and swelling If told, put ice on the painful area: If you have a removable splint or brace, remove it as told by your doctor. Put ice in a plastic bag. Place a towel between your skin and the bag. Leave the ice on for 20 minutes, 2-3 times per day. Do not fall asleep with the cold pack on your skin. Take off the ice if your skin turns bright red. This is very important. If you cannot feel pain, heat, or cold, you have a greater risk of damage to the area. Move your fingers often to reduce stiffness and swelling. General instructions Take over-the-counter and prescription medicines only as told by your doctor. Rest your wrist from any activity that may cause pain. If needed, talk with your boss at work about changes that can help your wrist heal. Do exercises as told by your doctor, physical therapist, or occupational therapist. Keep all follow-up visits. Contact a doctor if: You have new symptoms. Medicine does not help your pain. Your symptoms get worse. Get help right   away if: You have very bad numbness or tingling in your wrist or hand. Summary Carpal tunnel syndrome is a condition that causes pain in your hand and arm. It is often caused by repeated wrist motions. Lifestyle changes and medicines are used to treat this problem. Surgery may help in very bad cases. Follow your doctor's instructions about wearing a splint, resting your wrist, keeping follow-up visits, and calling for help. This information is not intended to replace advice given  to you by your health care provider. Make sure you discuss any questions you have with your health care provider. Document Revised: 05/03/2019 Document Reviewed: 05/03/2019 Elsevier Patient Education  2023 Elsevier Inc.  

## 2021-08-13 ENCOUNTER — Encounter (INDEPENDENT_AMBULATORY_CARE_PROVIDER_SITE_OTHER): Payer: Self-pay | Admitting: Family Medicine

## 2021-08-13 ENCOUNTER — Ambulatory Visit (INDEPENDENT_AMBULATORY_CARE_PROVIDER_SITE_OTHER): Payer: No Typology Code available for payment source | Admitting: Family Medicine

## 2021-08-13 VITALS — BP 103/74 | HR 68 | Temp 98.4°F | Ht 66.0 in | Wt 298.0 lb

## 2021-08-13 DIAGNOSIS — Z6841 Body Mass Index (BMI) 40.0 and over, adult: Secondary | ICD-10-CM

## 2021-08-13 DIAGNOSIS — E8881 Metabolic syndrome: Secondary | ICD-10-CM

## 2021-08-13 DIAGNOSIS — E282 Polycystic ovarian syndrome: Secondary | ICD-10-CM | POA: Diagnosis not present

## 2021-08-13 DIAGNOSIS — R0602 Shortness of breath: Secondary | ICD-10-CM | POA: Diagnosis not present

## 2021-08-13 DIAGNOSIS — F411 Generalized anxiety disorder: Secondary | ICD-10-CM | POA: Diagnosis not present

## 2021-08-13 DIAGNOSIS — E559 Vitamin D deficiency, unspecified: Secondary | ICD-10-CM | POA: Diagnosis not present

## 2021-08-13 DIAGNOSIS — E669 Obesity, unspecified: Secondary | ICD-10-CM

## 2021-08-13 DIAGNOSIS — R5383 Other fatigue: Secondary | ICD-10-CM | POA: Diagnosis not present

## 2021-08-13 DIAGNOSIS — Z1331 Encounter for screening for depression: Secondary | ICD-10-CM

## 2021-08-13 DIAGNOSIS — E162 Hypoglycemia, unspecified: Secondary | ICD-10-CM

## 2021-08-14 ENCOUNTER — Ambulatory Visit (HOSPITAL_BASED_OUTPATIENT_CLINIC_OR_DEPARTMENT_OTHER): Payer: No Typology Code available for payment source

## 2021-08-20 LAB — LIPID PANEL
Chol/HDL Ratio: 3.7 ratio (ref 0.0–4.4)
Cholesterol, Total: 156 mg/dL (ref 100–199)
HDL: 42 mg/dL (ref >39)
LDL Chol Calc (NIH): 98 mg/dL (ref 0–99)
Triglycerides: 84 mg/dL (ref 0–149)
VLDL Cholesterol Cal: 16 mg/dL (ref 5–40)

## 2021-08-20 LAB — VITAMIN B12: Vitamin B-12: 870 pg/mL (ref 232–1245)

## 2021-08-20 LAB — FOLATE: Folate: 14 ng/mL (ref >3.0)

## 2021-08-20 LAB — INSULIN, FREE AND TOTAL
Free Insulin: 20 uU/mL — ABNORMAL HIGH
Total Insulin: 20 uU/mL

## 2021-08-20 LAB — VITAMIN D 25 HYDROXY (VIT D DEFICIENCY, FRACTURES): Vit D, 25-Hydroxy: 19.5 ng/mL — ABNORMAL LOW (ref 30.0–100.0)

## 2021-08-25 NOTE — Progress Notes (Signed)
Chief Complaint:   OBESITY Tammy [name] (MR# [id number]) is a 21 y.o. female who presents for evaluation and treatment of obesity and related comorbidities. Current BMI is Body mass index is 48.1 kg/m. Tammy [name] has been struggling with her weight for many years and has been unsuccessful in either losing weight, maintaining weight loss, or reaching her healthy weight goal.  Tammy [name] has struggled with weight loss since early childhood.  She notes a family history of obesity with her mom.  Gained 80 pounds in the past year.  Has had health problems for 1 year, and less active.  Has felt more stressed, depressed, and anxious.  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  She is on Depo-Provera, started in May.  No big increase in weight since starting. Has secondary amenorrhea from Depo- Provera.  Denies much snacking or overeating.  Has sugar cravings.  Tammy [name] is currently in the action stage of change and ready to dedicate time achieving and maintaining a healthier weight. Tammy [name] is interested in becoming our patient and working on intensive lifestyle modifications including (but not limited to) diet and exercise for weight loss.  Tammy [name]'s habits were reviewed today and are as follows: Her family eats meals together, she thinks her family will eat healthier with her, her desired weight loss is 68 lbs, she has been heavy most of her life, she started gaining weight in Nov 2022, her heaviest weight ever was 304 pounds, she has significant food cravings issues, she skips meals frequently, she is frequently drinking liquids with calories, she frequently makes poor food choices, she frequently eats larger portions than normal, and she struggles with emotional eating.  Depression Screen Tammy [name]'s Food and Mood (modified PHQ-9) score was 15.     08/13/2021    7:46 AM  Depression screen PHQ 2/9  Decreased Interest 2  Down, Depressed, Hopeless 3  PHQ - 2 Score 5  Altered sleeping 3  Tired,  decreased energy 3  Change in appetite 1  Feeling bad or failure about yourself  1  Trouble concentrating 1  Moving slowly or fidgety/restless 1  Suicidal thoughts 0  PHQ-9 Score 15  Difficult doing work/chores Somewhat difficult   Subjective:   1. Other fatigue Tammy [name] admits to daytime somnolence and admits to waking up still tired. Patient has a history of symptoms of daytime fatigue, morning fatigue, and morning headache. Tammy [name] generally gets 6 or 8 hours of sleep per night, and states that she has nightime awakenings. Snoring is not present. Apneic episodes are present. Epworth Sleepiness Score is 9.  Likely due to hypoglycemia.  TSH, CBC, iron within normal limits on 06/09/2021.  2. SOB (shortness of breath) Tammy [name] notes increasing shortness of breath with exercising and seems to be worsening over time with weight gain. She notes getting out of breath sooner with activity than she used to. This has not gotten worse recently. Tammy [name] denies shortness of breath at rest or orthopnea.  3. Insulin resistance Tammy [name]'s last fasting insulin was high at 73 on 03/04/2021, with complaints of hypoglycemia.  4. PCOS (polycystic ovarian syndrome) Tammy [name] was diagnosed 5 years ago.  She has never been on metformin.  She has had 14 irregular menses, secondary to amenorrhea on Depo-Provera (weight gaining).  5. GAD (generalized anxiety disorder) Tammy [name] is on trazodone as needed.  Her stress levels are high and she feels that her health is poor.  Her PHQ-9 score is 15.  6. Vitamin D deficiency Tammy [name] was on  prescription vitamin D 50,000 units once weekly, and she has been off for 2 to 3 months.  Her last vitamin D level was 21.5 on 02/27/2021.  She complains of fatigue.  7. Hypoglycemia Tammy [name] has CGM readings between 49-60's, even postprandial consumption of high carbohydrate meals.  Had negative work-up for insulinoma. She plans to have a second Endocrinology opinion.   Assessment/Plan:   1. Other  fatigue Tammy [name] does feel that her weight is causing her energy to be lower than it should be. Fatigue may be related to obesity, depression or many other causes. Labs will be ordered, and in the meanwhile, Tammy [name] will focus on self care including making healthy food choices, increasing physical activity and focusing on stress reduction.  - EKG 12-Lead - VITAMIN D 25 Hydroxy (Vit-D Deficiency, Fractures) - Lipid panel - Vitamin B12 - Folate  2. SOB (shortness of breath) Tammy [name] does feel that she gets out of breath more easily that she used to when she exercises. Tammy [name]'s shortness of breath appears to be obesity related and exercise induced. She has agreed to work on weight loss and gradually increase exercise to treat her exercise induced shortness of breath. Will continue to monitor closely.  3. Insulin resistance Tammy [name] will consider future use of metformin if blood sugars stabilizes.  She will work on decreasing her intake of high sugar foods/drinks.  4. PCOS (polycystic ovarian syndrome) Tammy [name] will continue her plan of care per her OB/GYN.  5. GAD (generalized anxiety disorder) Tammy [name] will consider the use of sertraline  6. Vitamin D deficiency We will check labs today, and we will follow-up at [facility] next visit.  7. Hypoglycemia We will check labs today. Tammy [name] will change to small meals/snacks every 2 hours, protein, fat, carbohydrates instead of high sugar items.  - Insulin, Free and Total  8. Depression screening Tammy [name] had a positive depression screening. Depression is commonly associated with obesity and often results in emotional eating behaviors. We will monitor this closely and work on CBT to help improve the non-hunger eating patterns. Referral to Psychology may be required if no improvement is seen as she continues in our clinic.  9. Obesity, current BMI 48.2 Tammy [name] is currently in the action stage of change and her goal is to continue with weight loss efforts. I  recommend Tammy [name] begin the structured treatment plan as follows:  She has agreed to the Category 4 Plan.  She is to eat every 2 hours, smaller portions with carb/protein each time.  Decrease high sugar items due to insulin response.  Exercise goals: No exercise has been prescribed at this time.   Behavioral modification strategies: increasing lean protein intake, decreasing simple carbohydrates, increasing water intake, no skipping meals, better snacking choices, and decreasing junk food.  She was informed of the importance of frequent follow-up visits to maximize her success with intensive lifestyle modifications for her multiple health conditions. She was informed we would discuss her lab results at her next visit unless there is a critical issue that needs to be addressed sooner. Tammy [name] agreed to keep her next visit at the agreed upon time to discuss these results.  Objective:   Blood pressure 103/74, pulse 68, temperature 98.4 F (36.9 C), height 5\' 6"  (1.676 m), weight 298 lb (135.2 kg), SpO2 98 %. Body mass index is 48.1 kg/m.  EKG: Normal sinus rhythm, rate 64 BPM.  Indirect Calorimeter completed today shows a VO2 of 362 and a REE of 2506.  Her calculated basal metabolic rate is  2130 thus her basal metabolic rate is better than expected.  General: Cooperative, alert, well developed, in no acute distress. HEENT: Conjunctivae and lids unremarkable. Cardiovascular: Regular rhythm.  Lungs: Normal work of breathing. Neurologic: No focal deficits.   Lab Results  Component Value Date   CREATININE 0.90 06/09/2021   BUN 11 06/09/2021   NA 142 06/09/2021   K 4.0 06/09/2021   CL 106 06/09/2021   CO2 23 06/09/2021   Lab Results  Component Value Date   ALT 12 06/09/2021   AST 13 06/09/2021   ALKPHOS 70 06/09/2021   BILITOT 0.5 06/09/2021   Lab Results  Component Value Date   HGBA1C 4.5 (L) 06/09/2021   HGBA1C 4.5 (L) 02/27/2021   No results found for: "INSULIN" Lab Results   Component Value Date   TSH 2.140 06/09/2021   Lab Results  Component Value Date   CHOL 156 08/13/2021   HDL 42 08/13/2021   LDLCALC 98 08/13/2021   TRIG 84 08/13/2021   CHOLHDL 3.7 08/13/2021   Lab Results  Component Value Date   WBC 7.6 06/09/2021   HGB 11.4 06/09/2021   HCT 35.2 06/09/2021   MCV 90 06/09/2021   PLT 471 (H) 06/09/2021   Lab Results  Component Value Date   IRON 31 06/09/2021   TIBC 310 06/09/2021   FERRITIN 133 06/09/2021   Attestation Statements:   Reviewed by clinician on day of visit: allergies, medications, problem list, medical history, surgical history, family history, social history, and previous encounter notes.   Trude Mcburney, am acting as transcriptionist for Seymour Bars, DO.  I have reviewed the above documentation for accuracy and completeness, and I agree with the above. Glennis Brink, DO

## 2021-08-27 ENCOUNTER — Encounter (INDEPENDENT_AMBULATORY_CARE_PROVIDER_SITE_OTHER): Payer: Self-pay | Admitting: Family Medicine

## 2021-08-27 ENCOUNTER — Ambulatory Visit (INDEPENDENT_AMBULATORY_CARE_PROVIDER_SITE_OTHER): Payer: No Typology Code available for payment source | Admitting: Family Medicine

## 2021-08-27 VITALS — BP 104/70 | HR 73 | Temp 97.9°F | Ht 66.0 in | Wt 299.0 lb

## 2021-08-27 DIAGNOSIS — R7989 Other specified abnormal findings of blood chemistry: Secondary | ICD-10-CM | POA: Diagnosis not present

## 2021-08-27 DIAGNOSIS — E161 Other hypoglycemia: Secondary | ICD-10-CM

## 2021-08-27 DIAGNOSIS — E162 Hypoglycemia, unspecified: Secondary | ICD-10-CM | POA: Insufficient documentation

## 2021-08-27 DIAGNOSIS — E559 Vitamin D deficiency, unspecified: Secondary | ICD-10-CM | POA: Diagnosis not present

## 2021-08-27 DIAGNOSIS — Z6841 Body Mass Index (BMI) 40.0 and over, adult: Secondary | ICD-10-CM

## 2021-08-27 DIAGNOSIS — F411 Generalized anxiety disorder: Secondary | ICD-10-CM | POA: Diagnosis not present

## 2021-08-27 DIAGNOSIS — E669 Obesity, unspecified: Secondary | ICD-10-CM

## 2021-08-27 MED ORDER — SERTRALINE HCL 25 MG PO TABS: 25.0000 mg | ORAL_TABLET | Freq: Every day | ORAL | 0 refills | Status: DC

## 2021-08-27 MED ORDER — VITAMIN D (ERGOCALCIFEROL) 1.25 MG (50000 UNIT) PO CAPS: 50000.0000 [IU] | ORAL_CAPSULE | ORAL | 0 refills | Status: DC

## 2021-08-31 NOTE — Progress Notes (Signed)
Chief Complaint:   OBESITY Tammy [name] is here to discuss her progress with her obesity treatment plan along with follow-up of her obesity related diagnoses. Tammy [name] is on the Category 4 Plan and states she is following her eating plan approximately 75% of the time. Tammy [name] states she is not exercising.  Today's visit was #: 2 Starting weight: 298 lbs Starting date: 08/13/2021 Today's weight: 299 lbs Today's date: 08/27/2021 Total lbs lost to date: 0 Total lbs lost since last in-office visit: +1  Interim History: Not logging intake.  Eating every 2 hours due to low blood sugars with smaller portions, reduced high sugar items.  Feels tired and is not using CGM anymore. Stress levels are high.  Eats  more high carb snacks.   Subjective:   1. Hypoglycemia No longer drinking sugars.  Had negative workup endo for insulinoma  in the spring.  Wants 2nd opinion.  Working on eating smaller portions every 2 hours with lower glycemic index foods.   2. Hyperinsulinemia Likely the cause of hypoglycemia.  3. Vitamin D deficiency Last Vitamin D level 19.8.  4. GAD (generalized anxiety disorder) Worsening. Lack of sleep and complains of fibromyalgia (not previously diagnosed).  Previously had too much stimulation from Lexapro.   Assessment/Plan:   1. Hypoglycemia Referral - Ambulatory referral to Endocrinology (Dr Shawnee Knapp)  2. Hyperinsulinemia Consider use of Metformin and will reduce intake of high sugar food and drinks.   3. Vitamin D deficiency Restart - Vitamin D, Ergocalciferol, (DRISDOL) 1.25 MG (50000 UNIT) CAPS capsule; Take 1 capsule (50,000 Units total) by mouth every 7 (seven) days.  Dispense: 4 capsule; Refill: 0  4. GAD (generalized anxiety disorder) Begin- sertraline (ZOLOFT) 25 MG tablet; Take 1 tablet (25 mg total) by mouth daily.  Dispense: 30 tablet; Refill: 0  5. Obesity, current BMI 48.3 Track intake on my fitness pal.   Tammy [name] is currently in the action stage of  change. As such, her goal is to continue with weight loss efforts. She has agreed to the Category 4 Plan and keeping a food journal and adhering to recommended goals of 2000 calories and 120 protein daily.    Exercise goals:  As is.  Behavioral modification strategies: increasing lean protein intake, decreasing simple carbohydrates, increasing water intake, no skipping meals, meal planning and cooking strategies, better snacking choices, and keeping a strict food journal.  Tammy [name] has agreed to follow-up with our clinic in 2-3 weeks. She was informed of the importance of frequent follow-up visits to maximize her success with intensive lifestyle modifications for her multiple health conditions.   Objective:   Blood pressure 104/70, pulse 73, temperature 97.9 F (36.6 C), height 5\' 6"  (1.676 m), weight 299 lb (135.6 kg), SpO2 99 %. Body mass index is 48.26 kg/m.  General: Cooperative, alert, well developed, in no acute distress. HEENT: Conjunctivae and lids unremarkable. Cardiovascular: Regular rhythm.  Lungs: Normal work of breathing. Neurologic: No focal deficits.   Lab Results  Component Value Date   CREATININE 0.90 06/09/2021   BUN 11 06/09/2021   NA 142 06/09/2021   K 4.0 06/09/2021   CL 106 06/09/2021   CO2 23 06/09/2021   Lab Results  Component Value Date   ALT 12 06/09/2021   AST 13 06/09/2021   ALKPHOS 70 06/09/2021   BILITOT 0.5 06/09/2021   Lab Results  Component Value Date   HGBA1C 4.5 (L) 06/09/2021   HGBA1C 4.5 (L) 02/27/2021   No results found for: "INSULIN" Lab  Results  Component Value Date   TSH 2.140 06/09/2021   Lab Results  Component Value Date   CHOL 156 08/13/2021   HDL 42 08/13/2021   LDLCALC 98 08/13/2021   TRIG 84 08/13/2021   CHOLHDL 3.7 08/13/2021   Lab Results  Component Value Date   VD25OH 19.5 (L) 08/13/2021   VD25OH 21.5 (L) 02/27/2021   Lab Results  Component Value Date   WBC 7.6 06/09/2021   HGB 11.4 06/09/2021   HCT 35.2  06/09/2021   MCV 90 06/09/2021   PLT 471 (H) 06/09/2021   Lab Results  Component Value Date   IRON 31 06/09/2021   TIBC 310 06/09/2021   FERRITIN 133 06/09/2021    Attestation Statements:   Reviewed by clinician on day of visit: allergies, medications, problem list, medical history, surgical history, family history, social history, and previous encounter notes.  I, Malcolm Metro, am acting as Energy manager for Seymour Bars, DO.  I have reviewed the above documentation for accuracy and completeness, and I agree with the above. Glennis Brink, DO

## 2021-09-02 ENCOUNTER — Telehealth (INDEPENDENT_AMBULATORY_CARE_PROVIDER_SITE_OTHER): Payer: Self-pay | Admitting: Family Medicine

## 2021-09-02 NOTE — Telephone Encounter (Signed)
Pt and Pt mom call stating a referral was suppose to be sent over and pt states it is urgent. The fax number is [phone] so it needs to be resent by fax. She also wants a call from the CMA. Thanks!

## 2021-09-15 ENCOUNTER — Ambulatory Visit: Payer: No Typology Code available for payment source | Admitting: Internal Medicine

## 2021-09-22 ENCOUNTER — Ambulatory Visit (INDEPENDENT_AMBULATORY_CARE_PROVIDER_SITE_OTHER): Payer: No Typology Code available for payment source | Admitting: Family Medicine

## 2021-09-22 ENCOUNTER — Ambulatory Visit: Payer: No Typology Code available for payment source | Admitting: Internal Medicine

## 2021-10-02 ENCOUNTER — Telehealth: Payer: Self-pay

## 2021-10-02 NOTE — Telephone Encounter (Signed)
Spoke to patients mom, advised to go to UC, patient's mom expressed understanding, thanks!

## 2021-10-02 NOTE — Telephone Encounter (Signed)
Patients mom called office due to patient having pains going down to her neck, and patient has also almost collapsed a few times this week, patient is scheduled for ACUTE visit 10/2, please advise, thanks!

## 2021-10-02 NOTE — Telephone Encounter (Signed)
Spoke with patients mom, would like a call if possible, pt feelings there is no reason to go to UC, please advise, thanks!

## 2021-10-02 NOTE — Telephone Encounter (Signed)
Called pt she was advised to go to UC pt stated she doesn't see any reason why she should go. Provider stated she will call pt today

## 2021-10-05 ENCOUNTER — Encounter: Payer: Self-pay | Admitting: Physician Assistant

## 2021-10-05 ENCOUNTER — Ambulatory Visit (INDEPENDENT_AMBULATORY_CARE_PROVIDER_SITE_OTHER): Payer: No Typology Code available for payment source | Admitting: Physician Assistant

## 2021-10-05 VITALS — BP 127/87 | HR 81 | Temp 97.2°F | Ht 66.0 in | Wt 301.0 lb

## 2021-10-05 DIAGNOSIS — M542 Cervicalgia: Secondary | ICD-10-CM

## 2021-10-05 DIAGNOSIS — T383X5A Adverse effect of insulin and oral hypoglycemic [antidiabetic] drugs, initial encounter: Secondary | ICD-10-CM

## 2021-10-05 DIAGNOSIS — R1310 Dysphagia, unspecified: Secondary | ICD-10-CM | POA: Diagnosis not present

## 2021-10-05 DIAGNOSIS — K219 Gastro-esophageal reflux disease without esophagitis: Secondary | ICD-10-CM

## 2021-10-05 DIAGNOSIS — L940 Localized scleroderma [morphea]: Secondary | ICD-10-CM | POA: Diagnosis not present

## 2021-10-05 DIAGNOSIS — E160 Drug-induced hypoglycemia without coma: Secondary | ICD-10-CM | POA: Diagnosis not present

## 2021-10-05 DIAGNOSIS — M2550 Pain in unspecified joint: Secondary | ICD-10-CM

## 2021-10-05 NOTE — Progress Notes (Signed)
Established patient visit   Patient: Tammy [name]   DOB: 08-28-2000   21 y.o. Female  MRN: [id number] Visit Date: 10/05/2021  Chief Complaint  Patient presents with   Follow-up   Subjective    HPI  Patient presents for c/o neck pain (left sided) and trouble swallowing. Patient reports feeling better today. Reports frequent belching and chest pressure that occurs about once per week. No syncope or palpitations. Patient has a hx of chronic pain at multiple sites which happen randomly. Patient's mother has a hx of fibromyalgia. Patient with hx of hypoglycemia and hyperinsulinemia, needs to get established with endocrinologist and requesting referral to [facility].   [facility]- referral Rheumatology referral- l   Medications: Outpatient Medications Prior to Visit  Medication Sig   ibuprofen (ADVIL) 800 MG tablet Take 1 tablet (800 mg total) by mouth every 8 (eight) hours as needed.   medroxyPROGESTERone (DEPO-PROVERA) 150 MG/ML injection Inject 1 mL (150 mg total) into the muscle every 3 (three) months.   Rimegepant Sulfate (NURTEC) 75 MG TBDP Take 1 tablet by mouth daily as needed.   sertraline (ZOLOFT) 25 MG tablet Take 1 tablet (25 mg total) by mouth daily.   traZODone (DESYREL) 50 MG tablet Take 0.5-1 tablets (25-50 mg total) by mouth at bedtime as needed for sleep.   Vitamin D, Ergocalciferol, (DRISDOL) 1.25 MG (50000 UNIT) CAPS capsule Take 1 capsule (50,000 Units total) by mouth every 7 (seven) days.   No facility-administered medications prior to visit.    Review of Systems Review of Systems:  A fourteen system review of systems was performed and found to be positive as per HPI.  Last CBC Lab Results  Component Value Date   WBC 7.6 06/09/2021   HGB 11.4 06/09/2021   HCT 35.2 06/09/2021   MCV 90 06/09/2021   MCH 29.2 06/09/2021   RDW 12.1 06/09/2021   PLT 471 (H) 68/12/7515   Last metabolic panel Lab Results  Component Value Date   GLUCOSE 90 06/09/2021   NA  142 06/09/2021   K 4.0 06/09/2021   CL 106 06/09/2021   CO2 23 06/09/2021   BUN 11 06/09/2021   CREATININE 0.90 06/09/2021   EGFR 93 06/09/2021   CALCIUM 9.5 06/09/2021   PROT 7.0 06/09/2021   ALBUMIN 4.3 06/09/2021   LABGLOB 2.7 06/09/2021   AGRATIO 1.6 06/09/2021   BILITOT 0.5 06/09/2021   ALKPHOS 70 06/09/2021   AST 13 06/09/2021   ALT 12 06/09/2021   Last lipids Lab Results  Component Value Date   CHOL 156 08/13/2021   HDL 42 08/13/2021   LDLCALC 98 08/13/2021   TRIG 84 08/13/2021   CHOLHDL 3.7 08/13/2021   Last hemoglobin A1c Lab Results  Component Value Date   HGBA1C 4.5 (L) 06/09/2021   Last thyroid functions Lab Results  Component Value Date   TSH 2.140 06/09/2021       Objective    BP 127/87   Pulse 81   Temp (!) 97.2 F (36.2 C) (Temporal)   Ht 5' 6" (1.676 m)   Wt (!) 301 lb (136.5 kg)   BMI 48.58 kg/m  BP Readings from Last 3 Encounters:  10/05/21 127/87  08/27/21 104/70  08/13/21 103/74   Wt Readings from Last 3 Encounters:  10/05/21 (!) 301 lb (136.5 kg)  08/27/21 299 lb (135.6 kg)  08/13/21 298 lb (135.2 kg)    Physical Exam  General:  Well Developed, well nourished, appropriate for stated age.  Neuro:  Alert  and oriented,  extra-ocular muscles intact  HEENT:  Normocephalic, atraumatic, PERRL, normal TM's of both ears (some cerumen of right TM), tonsils absent, neck supple, mildly enlarged lymph nodes (submandibular)  Skin:  no gross rash, warm, pink. Cardiac:  RRR, S1 S2 Respiratory: CTA B/L  Vascular:  Ext warm, no cyanosis apprec.; +edema Psych:  No HI/SI, judgement and insight good, Euthymic mood. Full Affect.   No results found for any visits on 10/05/21.  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 Pt verbalized understanding. No s/sx consistent with strep pharyngitis present at this time. If enlarged lymph nodes fail to improve or worsen recommend further evaluation with ultrasound. Discussed belching and chest pressure are suggestive of acid reflux, discussed foods to avoid. If symptoms fail to improve or worsen then can trial H2 blocker such as famotidine 20 mg BID or PPI therapy such as omeprazole 20 mg daily. Patient has hx of  morphea scleroderma and has s/sx suggestive of possible fibromyalgia. Will place referrals to [facility] and [facility]umatology.   Return if symptoms worsen or fail to improve.        Lorrene Reid, PA-C  [facility] at [facility] [phone] (phone) [phone] (fax)  [place]

## 2021-10-07 ENCOUNTER — Other Ambulatory Visit: Payer: Self-pay

## 2021-10-07 ENCOUNTER — Emergency Department (HOSPITAL_BASED_OUTPATIENT_CLINIC_OR_DEPARTMENT_OTHER): Payer: BLUE CROSS/BLUE SHIELD

## 2021-10-07 ENCOUNTER — Other Ambulatory Visit (HOSPITAL_BASED_OUTPATIENT_CLINIC_OR_DEPARTMENT_OTHER): Payer: Self-pay

## 2021-10-07 ENCOUNTER — Encounter (HOSPITAL_BASED_OUTPATIENT_CLINIC_OR_DEPARTMENT_OTHER): Payer: Self-pay

## 2021-10-07 ENCOUNTER — Encounter: Payer: Self-pay | Admitting: Physician Assistant

## 2021-10-07 ENCOUNTER — Emergency Department (HOSPITAL_BASED_OUTPATIENT_CLINIC_OR_DEPARTMENT_OTHER)
Admission: EM | Admit: 2021-10-07 | Discharge: 2021-10-07 | Disposition: A | Discharge status: Home / Self Care | Payer: BLUE CROSS/BLUE SHIELD | Attending: Emergency Medicine | Admitting: Emergency Medicine

## 2021-10-07 DIAGNOSIS — S90222A Contusion of left lesser toe(s) with damage to nail, initial encounter: Secondary | ICD-10-CM

## 2021-10-07 DIAGNOSIS — W010XXA Fall on same level from slipping, tripping and stumbling without subsequent striking against object, initial encounter: Secondary | ICD-10-CM | POA: Insufficient documentation

## 2021-10-07 DIAGNOSIS — S9032XA Contusion of left foot, initial encounter: Secondary | ICD-10-CM | POA: Diagnosis not present

## 2021-10-07 DIAGNOSIS — Y92009 Unspecified place in unspecified non-institutional (private) residence as the place of occurrence of the external cause: Secondary | ICD-10-CM | POA: Diagnosis not present

## 2021-10-07 DIAGNOSIS — S99922A Unspecified injury of left foot, initial encounter: Secondary | ICD-10-CM | POA: Diagnosis present

## 2021-10-07 MED ORDER — DOXYCYCLINE HYCLATE 100 MG PO CAPS
100.0000 mg | ORAL_CAPSULE | Freq: Two times a day (BID) | ORAL | 0 refills | Status: DC
  Filled 2021-10-07: qty 14, 7d supply, fill #0

## 2021-10-07 MED ORDER — SODIUM CHLORIDE 0.9 % IR SOLN
Freq: Once | Status: AC
  Filled 2021-10-07: qty 1

## 2021-10-07 MED ORDER — CEPHALEXIN 250 MG PO CAPS
500.0000 mg | ORAL_CAPSULE | Freq: Once | ORAL | Status: AC
  Administered 2021-10-07: 500 mg via ORAL
  Filled 2021-10-07: qty 2

## 2021-10-07 MED ORDER — CEPHALEXIN 500 MG PO CAPS
500.0000 mg | ORAL_CAPSULE | Freq: Four times a day (QID) | ORAL | 0 refills | Status: DC
  Filled 2021-10-07: qty 28, 7d supply, fill #0

## 2021-10-07 MED ORDER — NAPROXEN 500 MG PO TABS
500.0000 mg | ORAL_TABLET | Freq: Two times a day (BID) | ORAL | 0 refills | Status: DC
  Filled 2021-10-07: qty 14, 7d supply, fill #0

## 2021-10-07 MED ORDER — BACITRACIN ZINC 500 UNIT/GM EX OINT
TOPICAL_OINTMENT | Freq: Two times a day (BID) | CUTANEOUS | Status: DC
  Filled 2021-10-07: qty 28.35

## 2021-10-07 MED ORDER — BACITRACIN ZINC 500 UNIT/GM EX OINT: TOPICAL_OINTMENT | Freq: Two times a day (BID) | CUTANEOUS | Status: DC

## 2021-10-07 MED ORDER — DOXYCYCLINE HYCLATE 100 MG PO TABS
100.0000 mg | ORAL_TABLET | Freq: Once | ORAL | Status: AC
Start: 2021-10-07 — End: 2021-10-07
  Administered 2021-10-07: 100 mg via ORAL
  Filled 2021-10-07: qty 1

## 2021-10-07 NOTE — ED Provider Notes (Signed)
[facility] EMERGENCY DEPT Provider Note   CSN: [id number] Arrival date & time: 10/07/21  [id number]     History  Chief Complaint  Patient presents with   Toe Injury    Tammy [name] is a 21 y.o. female.  HPI    21 year old female comes in with chief complaint of toe injury. This morning, patient bumped her toe into a bed frame.  She had immediate pain.  She is up-to-date with tetanus.  There is some bleeding noted.  Patient applied alcohol to the wound and came to the ER.  Home Medications Prior to Admission medications   Medication Sig Start Date End Date Taking? Authorizing Provider  cephALEXin (KEFLEX) 500 MG capsule Take 1 capsule (500 mg total) by mouth 4 (four) times daily. 10/07/21  Yes Varney Biles, MD  doxycycline (VIBRAMYCIN) 100 MG capsule Take 1 capsule (100 mg total) by mouth 2 (two) times daily. 10/07/21  Yes Ramez Arrona, MD  naproxen (NAPROSYN) 500 MG tablet Take 1 tablet (500 mg total) by mouth 2 (two) times daily. 10/07/21  Yes Varney Biles, MD  ibuprofen (ADVIL) 800 MG tablet Take 1 tablet (800 mg total) by mouth every 8 (eight) hours as needed. 04/21/21   Megan Salon, MD  medroxyPROGESTERone (DEPO-PROVERA) 150 MG/ML injection Inject 1 mL (150 mg total) into the muscle every 3 (three) months. 08/10/21   Lorrene Reid, PA-C  Rimegepant Sulfate (NURTEC) 75 MG TBDP Take 1 tablet by mouth daily as needed. 06/10/21   Lorrene Reid, PA-C  sertraline (ZOLOFT) 25 MG tablet Take 1 tablet (25 mg total) by mouth daily. 08/27/21   Bowen, Collene Leyden, DO  traZODone (DESYREL) 50 MG tablet Take 0.5-1 tablets (25-50 mg total) by mouth at bedtime as needed for sleep. 06/09/21   Lorrene Reid, PA-C  Vitamin D, Ergocalciferol, (DRISDOL) 1.25 MG (50000 UNIT) CAPS capsule Take 1 capsule (50,000 Units total) by mouth every 7 (seven) days. 08/27/21   Bowen, Collene Leyden, DO      Allergies    Patient has no known allergies.    Review of Systems   Review of Systems  Physical  Exam Updated Vital Signs BP 122/83   Pulse 92   Temp 97.6 F (36.4 C)   Resp 16   Ht 5\' 6"  (1.676 m)   Wt (!) 136.5 kg   SpO2 99%   BMI 48.58 kg/m  Physical Exam Vitals and nursing note reviewed.  Constitutional:      Appearance: She is well-developed.  HENT:     Head: Atraumatic.  Cardiovascular:     Rate and Rhythm: Normal rate.  Pulmonary:     Effort: Pulmonary effort is normal.  Musculoskeletal:        General: Swelling and tenderness present. No deformity.     Cervical back: Normal range of motion and neck supple.     Comments: Patient's lesser toe of the left foot is edematous but there is no gross deformity.  Patient has painted her nails and acrylic.  The nail itself is slightly avulsed, but fixed appropriately over the base/matrix.  There is bright red blood noted below the nail distally.  Nail has no laceration and is not deformed or grossly elevated  Skin:    General: Skin is warm and dry.  Neurological:     Mental Status: She is alert and oriented to person, place, and time.     ED Results / Procedures / Treatments   Labs (all labs ordered are listed, but only  abnormal results are displayed) Labs Reviewed - No data to display  EKG None  Radiology DG Toe 5th Left  Result Date: 10/07/2021 CLINICAL DATA:  Stubbed the left foot fifth toe, concern for fracture. EXAM: DG TOE 5TH LEFT COMPARISON:  None Available. FINDINGS: There is no evidence of fracture or dislocation. There is no evidence of arthropathy or other focal bone abnormality. There is generalized soft tissue swelling. IMPRESSION: Soft tissue swelling without evidence of fractures. Electronically Signed   By: Telford Nab M.D.   On: 10/07/2021 06:55    Procedures Procedures    Medications Ordered in ED Medications  bacitracin ointment ( Topical Given 10/07/21 0837)  bacitracin ointment (has no administration in time range)  povidone-iodine (APLICARE,BETADINE) 3.38% irrigation ( Irrigation  Given 10/07/21 0850)  cephALEXin (KEFLEX) capsule 500 mg (500 mg Oral Given 10/07/21 0836)  doxycycline (VIBRA-TABS) tablet 100 mg (100 mg Oral Given 10/07/21 2505)    ED Course/ Medical Decision Making/ A&P                           Medical Decision Making Risk OTC drugs. Prescription drug management.   20 year old female comes in with chief complaint of toe injury.  Patient is noted to have blood below the nail, avulsion of the nail.  Differential diagnosis includes avulsion injury of the nail, nailbed laceration, subungual hematoma, soft tissue injury to the toe, fracture of the toe.  X-ray ordered, interpreted independently.  No evidence of fracture or dislocation.  Patient is up-to-date with tetanus.  She is overall immunocompetent and healthy.  Has some lymphedema history.  Discussed with her the option of nail removal, nailbed evaluation and laceration repair if needed versus wait and watch approach -with turn to the ER if she starts having worsening of her symptoms.  Mother at the bedside.  She provides some history and also participates in decision making.  Patient has ultimately decided to wait and watch.  I think that is a reasonable approach, especially given that the nail is slightly avulsed and the blood has oozed out.  There is no active bleeding.  Patient is healthy.  Although unclear if there is subungual hematoma, my suspicion is low for it as well, as the blood has oozed out in an avulsed nail-i.e., there is means for the blood to decompress by itself.  We will start her on Keflex and Doxy, as mother wants to be aggressive with infection management.  I have established a follow-up with podiatry service for next week.  Return precautions discussed.  2 will be buddy taped, postop shoe provided.  Final Clinical Impression(s) / ED Diagnoses Final diagnoses:  Contusion of lesser toe of left foot with damage to nail, initial encounter    Rx / DC Orders ED Discharge  Orders          Ordered    naproxen (NAPROSYN) 500 MG tablet  2 times daily        10/07/21 0855    cephALEXin (KEFLEX) 500 MG capsule  4 times daily        10/07/21 0858    doxycycline (VIBRAMYCIN) 100 MG capsule  2 times daily        10/07/21 0858              Varney Biles, MD 10/07/21 [phone]

## 2021-10-07 NOTE — ED Triage Notes (Signed)
POV from home, pt tripped while getting ready and hit left pinky toe, toenail is bloody but bleeding controlled at this time. A&O x4, amb to room.

## 2021-10-07 NOTE — ED Notes (Signed)
Currently soaking pt's foot in betadine

## 2021-10-07 NOTE — Discharge Instructions (Addendum)
You are seen in the ER for toe injury.  There is no fracture on x-ray. We suspect that you have a nailbed injury, and your nail is slightly loose.  We want you to follow-up with podiatry team next Thursday.  Please apply dressing to the wound twice a day after cleaning it with soap and water and drying the toe properly.  Icing the injured area for the next 24 hours would also be helpful.  Keep the foot elevated.  Return to the ER immediately if you start having severe pain, swelling to the toe, fevers, chills, redness spreading from your toe to foot.

## 2021-10-12 ENCOUNTER — Encounter (INDEPENDENT_AMBULATORY_CARE_PROVIDER_SITE_OTHER): Payer: Self-pay | Admitting: Family Medicine

## 2021-10-12 ENCOUNTER — Ambulatory Visit (INDEPENDENT_AMBULATORY_CARE_PROVIDER_SITE_OTHER): Payer: No Typology Code available for payment source | Admitting: Family Medicine

## 2021-10-12 ENCOUNTER — Other Ambulatory Visit (HOSPITAL_BASED_OUTPATIENT_CLINIC_OR_DEPARTMENT_OTHER): Payer: Self-pay

## 2021-10-12 VITALS — BP 128/95 | HR 73 | Temp 97.8°F | Ht 66.0 in | Wt 297.0 lb

## 2021-10-12 DIAGNOSIS — G4726 Circadian rhythm sleep disorder, shift work type: Secondary | ICD-10-CM | POA: Diagnosis not present

## 2021-10-12 DIAGNOSIS — F411 Generalized anxiety disorder: Secondary | ICD-10-CM | POA: Diagnosis not present

## 2021-10-12 DIAGNOSIS — E669 Obesity, unspecified: Secondary | ICD-10-CM

## 2021-10-12 DIAGNOSIS — Z6841 Body Mass Index (BMI) 40.0 and over, adult: Secondary | ICD-10-CM

## 2021-10-12 DIAGNOSIS — E162 Hypoglycemia, unspecified: Secondary | ICD-10-CM

## 2021-10-12 DIAGNOSIS — E559 Vitamin D deficiency, unspecified: Secondary | ICD-10-CM

## 2021-10-12 MED ORDER — ACCU-CHEK FASTCLIX LANCETS MISC
0 refills | Status: DC
  Filled 2021-10-12: qty 102, 102d supply, fill #0

## 2021-10-12 MED ORDER — SERTRALINE HCL 25 MG PO TABS
25.0000 mg | ORAL_TABLET | Freq: Every day | ORAL | 0 refills | Status: DC
  Filled 2021-10-12: qty 30, 30d supply, fill #0

## 2021-10-15 ENCOUNTER — Ambulatory Visit: Payer: No Typology Code available for payment source | Admitting: Podiatry

## 2021-10-19 ENCOUNTER — Encounter: Payer: Self-pay | Admitting: Podiatry

## 2021-10-19 ENCOUNTER — Ambulatory Visit: Payer: No Typology Code available for payment source | Admitting: Podiatry

## 2021-10-19 DIAGNOSIS — S90122A Contusion of left lesser toe(s) without damage to nail, initial encounter: Secondary | ICD-10-CM

## 2021-10-20 ENCOUNTER — Encounter (INDEPENDENT_AMBULATORY_CARE_PROVIDER_SITE_OTHER): Payer: Self-pay

## 2021-10-20 NOTE — Progress Notes (Signed)
  Subjective:  Patient ID: Tammy [name], female    DOB: [birth year],  MRN: [id number]  Chief Complaint  Patient presents with   Toe Pain    Left 5th toe pt stated that she stubbed her toe on her bed.     21 y.o. female presents with the above complaint. History confirmed with patient.  She had x-rays done at the ER she has been wearing a surgical shoe since then  Objective:  Physical Exam: warm, good capillary refill, no trophic changes or ulcerative lesions, normal DP and PT pulses, normal sensory exam, and tenderness of fifth toe no ecchymosis no instability, nail is well attached there is subungual hematoma.   Radiographs: Radiographs from emergency room indicate no fracture Assessment:   1. Contusion of fifth toe of left foot, initial encounter      Plan:  Patient was evaluated and treated and all questions answered.  Discussed that she has a contusion of the toe.  She may return to regular shoe gear and bathing.  Nail is well attached we discussed the possibility of the nail being lost or needing removal.  She will return as needed if this is necessary.  No follow-ups on file.

## 2021-10-21 NOTE — Progress Notes (Unsigned)
Chief Complaint:   OBESITY Tammy [name] is here to discuss her progress with her obesity treatment plan along with follow-up of her obesity related diagnoses. Blannie is on the Category 4 Plan and states she is following her eating plan approximately 0% of the time. Carola states she has not been exercising.  Today's visit was #: 3 Starting weight: 298 lbs Starting date: 08/13/2021 Today's weight: 297 lbs Today's date: 10/12/21 Total lbs lost to date: -1 Total lbs lost since last in-office visit: -2  Interim History: No longer skipping meals.  Has increased protein intake.  Drinks some water, Coke, some juice, [employer].  Eats more vegetables.  Work shifts have varied.  Sleep has been lacking.  Trying to eat smaller more frequent meals though work schedule is a barrier.  Subjective:   1. Hypoglycemia Has done a better job with eating on schedule but work has been a barrier.  2. 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  3. Vitamin D deficiency On prescription vitamin D 50,000 IU weekly-has not been taking. Last vitamin D level 19.5.  4. GAD (generalized anxiety disorder) On sertraline 25 mg daily. Anxiety improving. Complains of poor sleep.   Assessment/Plan:   1. Hypoglycemia 1.  Refilled Accu-Chek lancets #100, no refill, to check blood sugars. 2.  Stop sugar sweetened beverages.  2. Shift work sleep disorder 1.  Try to get 7 to 8 hours of sleep no matter what.  3. Vitamin D deficiency Begin prescription vitamin D 50,000 IU weekly (has prescription).  4. GAD (generalized anxiety disorder) Refilled: - sertraline (ZOLOFT) 25 MG tablet; Take 1 tablet (25 mg total) by mouth daily.  Dispense: 30 tablet; Refill: 0 Follow-up with PCP for sleep problems.  5. Obesity,current BMI 48.0 1.  Stop sugar sweetened beverages which worsen blood sugar highs and lows. 2.  Eat small meals every 3-4 hours-lean protein and  fiber.  Trinita is currently in the action stage of change. As such, her goal is to continue with weight loss efforts. She has agreed to the Category 4 Plan.   Exercise goals: Increase walking to 30 minutes/day.  Behavioral modification strategies: increasing lean protein intake, decreasing simple carbohydrates, increasing vegetables, increasing water intake, decreasing liquid calories, decreasing eating out, no skipping meals, meal planning and cooking strategies, better snacking choices, and decreasing junk food.  Larue has agreed to follow-up with our clinic in 4 weeks. She was informed of the importance of frequent follow-up visits to maximize her success with intensive lifestyle modifications for her multiple health conditions.    Objective:   Blood pressure (!) 128/95, pulse 73, temperature 97.8 F (36.6 C), height 5\' 6"  (1.676 m), weight 297 lb (134.7 kg), SpO2 98 %. Body mass index is 47.94 kg/m.  General: Cooperative, alert, well developed, in no acute distress. HEENT: Conjunctivae and lids unremarkable. Cardiovascular: Regular rhythm.  Lungs: Normal work of breathing. Neurologic: No focal deficits.   Lab Results  Component Value Date   CREATININE 0.90 06/09/2021   BUN 11 06/09/2021   NA 142 06/09/2021   K 4.0 06/09/2021   CL 106 06/09/2021   CO2 23 06/09/2021   Lab Results  Component Value Date   ALT 12 06/09/2021   AST 13 06/09/2021   ALKPHOS 70 06/09/2021   BILITOT 0.5 06/09/2021   Lab Results  Component Value Date   HGBA1C 4.5 (L) 06/09/2021   HGBA1C 4.5 (L) 02/27/2021   No results  found for: "INSULIN" Lab Results  Component Value Date   TSH 2.140 06/09/2021   Lab Results  Component Value Date   CHOL 156 08/13/2021   HDL 42 08/13/2021   LDLCALC 98 08/13/2021   TRIG 84 08/13/2021   CHOLHDL 3.7 08/13/2021   Lab Results  Component Value Date   VD25OH 19.5 (L) 08/13/2021   VD25OH 21.5 (L) 02/27/2021   Lab Results  Component Value Date   WBC  7.6 06/09/2021   HGB 11.4 06/09/2021   HCT 35.2 06/09/2021   MCV 90 06/09/2021   PLT 471 (H) 06/09/2021   Lab Results  Component Value Date   IRON 31 06/09/2021   TIBC 310 06/09/2021   FERRITIN 133 06/09/2021    Attestation Statements:   Reviewed by clinician on day of visit: allergies, medications, problem list, medical history, surgical history, family history, social history, and previous encounter notes.  I, Georgianne Fick, FNP, am acting as transcriptionist for Dr. Loyal Gambler.  I have reviewed the above documentation for accuracy and completeness, and I agree with the above. Dell Ponto, DO

## 2021-10-23 ENCOUNTER — Encounter: Payer: Self-pay | Admitting: Physician Assistant

## 2021-10-23 ENCOUNTER — Other Ambulatory Visit (HOSPITAL_BASED_OUTPATIENT_CLINIC_OR_DEPARTMENT_OTHER): Payer: Self-pay

## 2021-10-23 ENCOUNTER — Ambulatory Visit (HOSPITAL_BASED_OUTPATIENT_CLINIC_OR_DEPARTMENT_OTHER): Payer: No Typology Code available for payment source

## 2021-10-23 DIAGNOSIS — E161 Other hypoglycemia: Secondary | ICD-10-CM

## 2021-10-23 DIAGNOSIS — E162 Hypoglycemia, unspecified: Secondary | ICD-10-CM

## 2021-10-28 ENCOUNTER — Other Ambulatory Visit (HOSPITAL_BASED_OUTPATIENT_CLINIC_OR_DEPARTMENT_OTHER): Payer: Self-pay

## 2021-10-28 MED ORDER — FREESTYLE LIBRE 3 SENSOR MISC
6 refills | Status: DC
  Filled 2021-10-28: qty 2, 28d supply, fill #0

## 2021-11-02 ENCOUNTER — Ambulatory Visit (INDEPENDENT_AMBULATORY_CARE_PROVIDER_SITE_OTHER): Payer: No Typology Code available for payment source

## 2021-11-02 DIAGNOSIS — Z3042 Encounter for surveillance of injectable contraceptive: Secondary | ICD-10-CM

## 2021-11-02 MED ORDER — MEDROXYPROGESTERONE ACETATE 150 MG/ML IM SUSP
150.0000 mg | Freq: Once | INTRAMUSCULAR | Status: AC
  Administered 2021-11-02: 150 mg via INTRAMUSCULAR

## 2021-11-02 NOTE — Progress Notes (Signed)
Patient came in today to receive MedroxyProgesterone Acetate 150mg . Injection was given in the right glut. Med. Patient tolerated injection well. Patient was informed that next injection can be given as early as 01/18/2022 but no later than 02/01/2022. tbw

## 2021-11-09 ENCOUNTER — Other Ambulatory Visit (HOSPITAL_BASED_OUTPATIENT_CLINIC_OR_DEPARTMENT_OTHER): Payer: Self-pay

## 2021-11-09 ENCOUNTER — Ambulatory Visit (INDEPENDENT_AMBULATORY_CARE_PROVIDER_SITE_OTHER): Payer: No Typology Code available for payment source | Admitting: Physician Assistant

## 2021-11-09 ENCOUNTER — Encounter: Payer: Self-pay | Admitting: Physician Assistant

## 2021-11-09 VITALS — BP 136/88 | HR 79 | Resp 18 | Ht 66.0 in | Wt 301.0 lb

## 2021-11-09 DIAGNOSIS — L739 Follicular disorder, unspecified: Secondary | ICD-10-CM

## 2021-11-09 DIAGNOSIS — E161 Other hypoglycemia: Secondary | ICD-10-CM

## 2021-11-09 DIAGNOSIS — E162 Hypoglycemia, unspecified: Secondary | ICD-10-CM | POA: Diagnosis not present

## 2021-11-09 MED ORDER — MUPIROCIN 2 % EX OINT
1.0000 | TOPICAL_OINTMENT | Freq: Two times a day (BID) | CUTANEOUS | 0 refills | Status: DC
  Filled 2021-11-09: qty 22, 30d supply, fill #0

## 2021-11-09 NOTE — Patient Instructions (Signed)
Folliculitis  Folliculitis occurs when hair follicles become inflamed. A hair follicle is a tiny opening in your skin where your hair grows from. This condition often occurs on the scalp, thighs, legs, back, and buttocks but can happen anywhere on the body. What are the causes? A common cause of this condition is an infection from bacteria. The type of folliculitis caused by bacteria can last a long time or go away and come back. The bacteria can live anywhere on your skin. They are often found in the nostrils. Other causes may include: An infection from a fungus. An infection from a virus. Your skin touching some chemicals, such as oils and tars. Shaving or waxing. Greasy ointments or creams put on the skin. What increases the risk? You are more likely to develop this condition if: Your body has a weak disease-fighting system (immune system). You have diabetes. You are obese. What are the signs or symptoms? Symptoms of this condition include: Redness. Soreness. Swelling. Itching. Small white or yellow, itchy spots filled with pus (pustules) that appear over a red area. If the infection goes deep into the follicle, these may turn into a boil (furuncle). A group of boils (carbuncle). These tend to form in hairy, sweaty areas of the body. How is this diagnosed? This condition is diagnosed with a skin exam. Your health care provider may take a sample of one of the pustules or boils to test in a lab. How is this treated? This condition may be treated by: Putting a warm, wet cloth (warm compress) on the affected areas. Taking antibiotics or applying them to the skin. Applying or bathing with a solution that kills germs (antiseptic). Taking an over-the-counter medicine. This can help with itching. Having a procedure to drain pustules or boils. This may be done if a pustule or boil contains a lot of pus or fluid. Having laser hair removal. This may be done when the condition lasts for a  long time. Follow these instructions at home: Managing pain and swelling  If directed, apply heat to the affected area as often as told by your health care provider. Use the heat source that your health care provider recommends, such as a moist heat pack or a heating pad. Place a towel between your skin and the heat source. Leave the heat on for 20-30 minutes. If your skin turns bright red, remove the heat right away to prevent burns. The risk of burns is higher if you cannot feel pain, heat, or cold. General instructions Take over-the-counter and prescription medicines only as told by your health care provider. If you were prescribed antibiotics, take or apply them as told by your health care provider. Do not stop using the antibiotic even if you start to feel better. Check your irritated area every day for signs of infection. Check for: More redness, swelling, or pain. Fluid or blood. Warmth. Pus or a bad smell. Do not shave irritated skin. Keep all follow-up visits. Your health care provider will check if the treatments are helping. Contact a health care provider if: You have a fever. You have any signs of infection. Red streaks are spreading from the affected area. This information is not intended to replace advice given to you by your health care provider. Make sure you discuss any questions you have with your health care provider. Document Revised: 05/26/2021 Document Reviewed: 05/26/2021 Elsevier Patient Education  2023 [employer]  

## 2021-11-09 NOTE — Progress Notes (Signed)
Established patient visit   Patient: Tammy [name]   DOB: January 25, 2000   21 y.o. Female  MRN: [id number] Visit Date: 11/09/2021  Chief Complaint  Patient presents with   Follow-up    Non-fasting   Subjective    HPI HPI     Follow-up    Additional comments: Non-fasting      Last edited by Gemma Payor, CMA on 11/09/2021  1:55 PM.      Patient presents for discussion of CGM. Patient has a history of hypoglycemia and hyperinsulinemia. States test strips she was prescribed by [facility] were the wrong ones for her meter. Has not been able to check her sugars and would be more compliant with a continuous glucose monitor. Also reports lymph nodes underneath armpits have been swollen and is sweating more than usual, has ingrown hair under right armpit which has been tender. 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   Medications: Outpatient Medications Prior to Visit  Medication Sig   Continuous Blood Gluc Sensor (FREESTYLE LIBRE 3 SENSOR) MISC Place 1 sensor on the skin every 14 days. Use to check glucose continuously   ibuprofen (ADVIL) 800 MG tablet Take 1 tablet (800 mg total) by mouth every 8 (eight) hours as needed.   medroxyPROGESTERone (DEPO-PROVERA) 150 MG/ML injection Inject 1 mL (150 mg total) into the muscle every 3 (three) months.   naproxen (NAPROSYN) 500 MG tablet Take 1 tablet (500 mg total) by mouth 2 (two) times daily.   Rimegepant Sulfate (NURTEC) 75 MG TBDP Take 1 tablet by mouth daily as needed.   sertraline (ZOLOFT) 25 MG tablet Take 1 tablet (25 mg total) by mouth daily.   traZODone (DESYREL) 50 MG tablet Take 0.5-1 tablets (25-50 mg total) by mouth at bedtime as needed for sleep.   Vitamin D, Ergocalciferol, (DRISDOL) 1.25 MG (50000 UNIT) CAPS capsule Take 1 capsule (50,000 Units total) by mouth every 7 (seven) days.   Accu-Chek FastClix Lancets MISC Use once daily as directed (Patient not taking: Reported on 11/09/2021)    cephALEXin (KEFLEX) 500 MG capsule Take 1 capsule (500 mg total) by mouth 4 (four) times daily. (Patient not taking: Reported on 11/09/2021)   doxycycline (VIBRAMYCIN) 100 MG capsule Take 1 capsule (100 mg total) by mouth 2 (two) times daily. (Patient not taking: Reported on 11/09/2021)   No facility-administered medications prior to visit.    Review of Systems Review of Systems:  A fourteen system review of systems was performed and found to be positive as per HPI.      Objective    BP 136/88 (BP Location: Right Arm, Patient Position: Sitting, Cuff Size: Large)   Pulse 79   Resp 18   Ht 5\' 6"  (1.676 m)   Wt (!) 301 lb (136.5 kg)   SpO2 98%   BMI 48.58 kg/m  BP Readings from Last 3 Encounters:  11/09/21 136/88  10/12/21 (!) 128/95  10/07/21 122/83   Wt Readings from Last 3 Encounters:  11/09/21 (!) 301 lb (136.5 kg)  10/12/21 297 lb (134.7 kg)  10/07/21 (!) 301 lb (136.5 kg)    Physical Exam  General:  Pleasant and cooperative, appropriate for stated age.  Neuro:  Alert and oriented,  extra-ocular muscles intact  HEENT:  Normocephalic, atraumatic, neck supple  Skin:  no gross rash Cardiac:  RRR, S1 S2 Respiratory: CTA B/L  Vascular:  Ext warm, no cyanosis apprec.; cap RF less 2 sec. Psych:  No HI/SI, judgement and  insight good, Euthymic mood. Full Affect.   No results found for any visits on 11/09/21.  Assessment & Plan      Problem List Items Addressed This Visit       Digestive   Hyperinsulinemia     Endocrine   Hypoglycemia - Primary   Other Visit Diagnoses     Folliculitis       Relevant Medications   mupirocin ointment (BACTROBAN) 2 %      Discussed with patient rx for [employer] 3 sensor was sent 10/28/21 and to check with her pharmacy. If CGM not covered by her insurance, then advised to let me know what brand is her glucometer and I can send in test strips. Recommend to apply mupirocin 2% ointment to right axilla lesion twice daily for 7-10  days. If symptoms fail to improve or worsen recommend to follow-up.   Return if symptoms worsen or fail to improve.        Mayer Masker, PA-C  [facility] at [facility] [phone] (phone) [phone] (fax)  [facility]

## 2021-11-10 ENCOUNTER — Other Ambulatory Visit: Payer: Self-pay | Admitting: Physician Assistant

## 2021-11-10 ENCOUNTER — Other Ambulatory Visit (HOSPITAL_BASED_OUTPATIENT_CLINIC_OR_DEPARTMENT_OTHER): Payer: Self-pay

## 2021-11-10 DIAGNOSIS — E161 Other hypoglycemia: Secondary | ICD-10-CM

## 2021-11-10 DIAGNOSIS — E162 Hypoglycemia, unspecified: Secondary | ICD-10-CM

## 2021-11-10 MED ORDER — DEXCOM G6 RECEIVER DEVI
1 refills | Status: DC
  Filled 2021-11-10: qty 1, 30d supply, fill #0

## 2021-11-10 MED ORDER — DEXCOM G6 TRANSMITTER MISC
2 refills | Status: DC
  Filled 2021-11-10: qty 1, 90d supply, fill #0
  Filled 2022-01-11 – 2022-01-28 (×3): qty 1, 90d supply, fill #1

## 2021-11-10 MED ORDER — DEXCOM G6 SENSOR MISC
3 refills | Status: DC
  Filled 2021-11-10: qty 3, 30d supply, fill #0

## 2021-11-11 NOTE — Progress Notes (Deleted)
TeleHealth Visit:  This visit was completed with telemedicine (audio/video) technology. Tammy [name] has verbally consented to this TeleHealth visit. The patient is located at home, the provider is located at home. The participants in this visit include the listed provider and patient. The visit was conducted today via MyChart video.  OBESITY Tammy [name] is here to discuss her progress with her obesity treatment plan along with follow-up of her obesity related diagnoses.   Today's visit was # 4 Starting weight: 298 lbs Starting date: 08/13/2021 Weight at last in office visit: 297 lbs on 10/12/21 Total weight loss: 1 lbs at last in office visit on 10/12/21. Today's reported weight: *** lbs No weight reported.  Nutrition Plan: the Category 4 Plan.   Current exercise: {exercise types:16438} none  Interim History: ***  Assessment/Plan:  1. ***  2. ***  3. ***  Obesity: Current BMI *** Tammy [name] {CHL AMB IS/IS NOT:210130109} currently in the action stage of change. As such, her goal is to {MWMwtloss#1:210800005}.  She has agreed to {MWMwtlossportion/plan2:23431}.   Exercise goals: {[facility] EXERCISE RECS:23473}  Behavioral modification strategies: {MWMwtlossdietstrategies3:23432}.  Tammy [name] has agreed to follow-up with our clinic in {NUMBER 1-10:22536} weeks.   No orders of the defined types were placed in this encounter.   There are no discontinued medications.   No orders of the defined types were placed in this encounter.     Objective:   VITALS: Per patient if applicable, see vitals. GENERAL: Alert and in no acute distress. CARDIOPULMONARY: No increased WOB. Speaking in clear sentences.  PSYCH: Pleasant and cooperative. Speech normal rate and rhythm. Affect is appropriate. Insight and judgement are appropriate. Attention is focused, linear, and appropriate.  NEURO: Oriented as arrived to appointment on time with no prompting.   Lab Results  Component Value Date   CREATININE  0.90 06/09/2021   BUN 11 06/09/2021   NA 142 06/09/2021   K 4.0 06/09/2021   CL 106 06/09/2021   CO2 23 06/09/2021   Lab Results  Component Value Date   ALT 12 06/09/2021   AST 13 06/09/2021   ALKPHOS 70 06/09/2021   BILITOT 0.5 06/09/2021   Lab Results  Component Value Date   HGBA1C 4.5 (L) 06/09/2021   HGBA1C 4.5 (L) 02/27/2021   No results found for: "INSULIN" Lab Results  Component Value Date   TSH 2.140 06/09/2021   Lab Results  Component Value Date   CHOL 156 08/13/2021   HDL 42 08/13/2021   LDLCALC 98 08/13/2021   TRIG 84 08/13/2021   CHOLHDL 3.7 08/13/2021   Lab Results  Component Value Date   WBC 7.6 06/09/2021   HGB 11.4 06/09/2021   HCT 35.2 06/09/2021   MCV 90 06/09/2021   PLT 471 (H) 06/09/2021   Lab Results  Component Value Date   IRON 31 06/09/2021   TIBC 310 06/09/2021   FERRITIN 133 06/09/2021   Lab Results  Component Value Date   VD25OH 19.5 (L) 08/13/2021   VD25OH 21.5 (L) 02/27/2021    Attestation Statements:   Reviewed by clinician on day of visit: allergies, medications, problem list, medical history, surgical history, family history, social history, and previous encounter notes.  ***(delete if time-based billing not used) Time spent on visit including the items listed below was *** minutes.  -preparing to see the patient (e.g., review of tests, history, previous notes) -obtaining and/or reviewing separately obtained history -counseling and educating the patient/family/caregiver -documenting clinical information in the electronic or other health record -ordering medications, tests, or  procedures -independently interpreting results and communicating results to the patient/ family/caregiver -referring and communicating with other health care professionals  -care coordination

## 2021-11-12 ENCOUNTER — Telehealth (INDEPENDENT_AMBULATORY_CARE_PROVIDER_SITE_OTHER): Payer: No Typology Code available for payment source | Admitting: Family Medicine

## 2021-12-08 NOTE — Progress Notes (Deleted)
TeleHealth Visit:  This visit was completed with telemedicine (audio/video) technology. Tammy [name] has verbally consented to this TeleHealth visit. The patient is located at home, the provider is located at home. The participants in this visit include the listed provider and patient. The visit was conducted today via MyChart video.  OBESITY Tammy [name] is here to discuss her progress with her obesity treatment plan along with follow-up of her obesity related diagnoses.   Today's visit was # 4 Starting weight: 298 lbs Starting date: 08/13/2021 Weight at last in office visit: 297 lbs on 10/12/21 Total weight loss: 1 lbs at last in office visit on 10/12/21. Today's reported weight: *** lbs No weight reported.  Nutrition Plan: the Category 4 Plan.   Current exercise: {exercise types:16438} none  Interim History: ***  Assessment/Plan:  1. ***  2. ***  3. ***  Obesity: Current BMI *** Tammy [name] {CHL AMB IS/IS NOT:210130109} currently in the action stage of change. As such, her goal is to {MWMwtloss#1:210800005}.  She has agreed to {MWMwtlossportion/plan2:23431}.   Exercise goals: {MWM EXERCISE RECS:23473}  Behavioral modification strategies: {MWMwtlossdietstrategies3:23432}.  Tammy [name] has agreed to follow-up with our clinic in {NUMBER 1-10:22536} weeks.   No orders of the defined types were placed in this encounter.   There are no discontinued medications.   No orders of the defined types were placed in this encounter.     Objective:   VITALS: Per patient if applicable, see vitals. GENERAL: Alert and in no acute distress. CARDIOPULMONARY: No increased WOB. Speaking in clear sentences.  PSYCH: Pleasant and cooperative. Speech normal rate and rhythm. Affect is appropriate. Insight and judgement are appropriate. Attention is focused, linear, and appropriate.  NEURO: Oriented as arrived to appointment on time with no prompting.   Lab Results  Component Value Date   CREATININE  0.90 06/09/2021   BUN 11 06/09/2021   NA 142 06/09/2021   K 4.0 06/09/2021   CL 106 06/09/2021   CO2 23 06/09/2021   Lab Results  Component Value Date   ALT 12 06/09/2021   AST 13 06/09/2021   ALKPHOS 70 06/09/2021   BILITOT 0.5 06/09/2021   Lab Results  Component Value Date   HGBA1C 4.5 (L) 06/09/2021   HGBA1C 4.5 (L) 02/27/2021   No results found for: "INSULIN" Lab Results  Component Value Date   TSH 2.140 06/09/2021   Lab Results  Component Value Date   CHOL 156 08/13/2021   HDL 42 08/13/2021   LDLCALC 98 08/13/2021   TRIG 84 08/13/2021   CHOLHDL 3.7 08/13/2021   Lab Results  Component Value Date   WBC 7.6 06/09/2021   HGB 11.4 06/09/2021   HCT 35.2 06/09/2021   MCV 90 06/09/2021   PLT 471 (H) 06/09/2021   Lab Results  Component Value Date   IRON 31 06/09/2021   TIBC 310 06/09/2021   FERRITIN 133 06/09/2021   Lab Results  Component Value Date   VD25OH 19.5 (L) 08/13/2021   VD25OH 21.5 (L) 02/27/2021    Attestation Statements:   Reviewed by clinician on day of visit: allergies, medications, problem list, medical history, surgical history, family history, social history, and previous encounter notes.  ***(delete if time-based billing not used) Time spent on visit including the items listed below was *** minutes.  -preparing to see the patient (e.g., review of tests, history, previous notes) -obtaining and/or reviewing separately obtained history -counseling and educating the patient/family/caregiver -documenting clinical information in the electronic or other health record -ordering medications, tests, or   procedures -independently interpreting results and communicating results to the patient/ family/caregiver -referring and communicating with other health care professionals  -care coordination

## 2021-12-09 ENCOUNTER — Telehealth (INDEPENDENT_AMBULATORY_CARE_PROVIDER_SITE_OTHER): Payer: No Typology Code available for payment source | Admitting: Family Medicine

## 2021-12-10 ENCOUNTER — Ambulatory Visit: Payer: No Typology Code available for payment source | Admitting: Physician Assistant

## 2021-12-21 ENCOUNTER — Other Ambulatory Visit (HOSPITAL_BASED_OUTPATIENT_CLINIC_OR_DEPARTMENT_OTHER): Payer: Self-pay

## 2021-12-21 MED ORDER — AZITHROMYCIN 250 MG PO TABS
ORAL_TABLET | ORAL | 0 refills | Status: DC
  Filled 2021-12-21: qty 6, 5d supply, fill #0

## 2021-12-23 ENCOUNTER — Other Ambulatory Visit: Payer: Self-pay

## 2021-12-23 ENCOUNTER — Other Ambulatory Visit (HOSPITAL_BASED_OUTPATIENT_CLINIC_OR_DEPARTMENT_OTHER): Payer: Self-pay

## 2021-12-23 MED ORDER — ALBUTEROL SULFATE HFA 108 (90 BASE) MCG/ACT IN AERS
INHALATION_SPRAY | RESPIRATORY_TRACT | 0 refills | Status: DC
  Filled 2021-12-23: qty 8.5, 17d supply, fill #0

## 2021-12-23 MED ORDER — METHYLPREDNISOLONE 4 MG PO TBPK
ORAL_TABLET | ORAL | 0 refills | Status: DC
  Filled 2021-12-23: qty 21, 6d supply, fill #0

## 2021-12-23 MED ORDER — PROMETHAZINE-DM 6.25-15 MG/5ML PO SYRP
5.0000 mL | ORAL_SOLUTION | ORAL | 0 refills | Status: DC | PRN
  Filled 2021-12-23: qty 100, 3d supply, fill #0
  Filled 2021-12-23: qty 40, 2d supply, fill #0
  Filled 2021-12-23: qty 100, 4d supply, fill #0
  Filled 2021-12-23: qty 40, 1d supply, fill #0

## 2021-12-24 ENCOUNTER — Encounter: Payer: Self-pay | Admitting: Nurse Practitioner

## 2021-12-24 ENCOUNTER — Other Ambulatory Visit: Payer: Self-pay

## 2021-12-24 ENCOUNTER — Ambulatory Visit: Payer: No Typology Code available for payment source | Admitting: Nurse Practitioner

## 2021-12-24 ENCOUNTER — Other Ambulatory Visit (HOSPITAL_BASED_OUTPATIENT_CLINIC_OR_DEPARTMENT_OTHER): Payer: Self-pay

## 2021-12-24 VITALS — BP 126/84 | HR 111 | Temp 98.3°F | Resp 18 | Ht 66.0 in | Wt 297.0 lb

## 2021-12-24 DIAGNOSIS — F411 Generalized anxiety disorder: Secondary | ICD-10-CM

## 2021-12-24 DIAGNOSIS — J209 Acute bronchitis, unspecified: Secondary | ICD-10-CM

## 2021-12-24 DIAGNOSIS — L940 Localized scleroderma [morphea]: Secondary | ICD-10-CM

## 2021-12-24 DIAGNOSIS — E162 Hypoglycemia, unspecified: Secondary | ICD-10-CM | POA: Diagnosis not present

## 2021-12-24 MED ORDER — CEFTRIAXONE SODIUM 1 G IJ SOLR
1.0000 g | Freq: Once | INTRAMUSCULAR | Status: AC
  Administered 2021-12-24: 1 g via INTRAMUSCULAR

## 2021-12-24 MED ORDER — METHYLPREDNISOLONE SODIUM SUCC 125 MG IJ SOLR
125.0000 mg | Freq: Once | INTRAMUSCULAR | Status: AC
  Administered 2021-12-24: 125 mg via INTRAMUSCULAR

## 2021-12-24 MED ORDER — CEFTRIAXONE SODIUM 1 G IJ SOLR
1.0000 g | Freq: Once | INTRAMUSCULAR | 0 refills | Status: DC
  Filled 2021-12-24: qty 1, 1d supply, fill #0

## 2021-12-24 NOTE — Progress Notes (Signed)
Established patient visit   Patient: Tammy [name]   DOB: [birth year]   21 y.o. Female  MRN: [id number] Visit Date: 12/24/2021   Chief Complaint  Patient presents with   URI   Subjective    Has been to Urgent Care twice  -first visit was Monday - was started on Z-pack -went back when chest pain was so significant on Tuesday.  --was then started on medrol, albuterol inhaler, and cough suppressant.  She states that she is not currently feeling any better.  --the patient has been out of work since Tuesday 12/22/2021 due to severity of symptoms.  I have recommended she stay out of work through tomorrow 12/25/2021. She is scheduled to return to work on Tuesday 12/29/2021.   URI  This is a new problem. The current episode started 1 to 4 weeks ago. The problem has been unchanged. There has been no fever. Associated symptoms include chest pain, congestion, coughing, ear pain, headaches, neck pain, rhinorrhea, sinus pain and swollen glands. Associated symptoms comments: [place] lymph nodes in her neck and under arms. . She has tried antihistamine, decongestant and inhaler use (antibiotics, rescue inhaler) for the symptoms. The treatment provided no relief.     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  -states that she has been going to work while she is not feeling well.  -fatigue is severe -blood sugars are persistently low.  -she does have upcoming appointment with endocrinology in [facility].  -may need new referral to rheumatology  -gets sinus congestion, feels bad, where she feels as though she should call out of work for 2 to 3 days every month.  -she is asking for reasonable accommodations for work so that she can miss work without it negatively effecting her job.    Medications: Outpatient Medications Prior to Visit  Medication Sig   albuterol (VENTOLIN HFA) 108 (90 Base) MCG/ACT inhaler Inhale 2 puffs into the lungs every 4 hours as  needed.   azithromycin (ZITHROMAX Z-PAK) 250 MG tablet Take 2 tablets by mouth once daily for 1 day then 1 tablet daily for the next 4 days.   Continuous Blood Gluc Receiver (DEXCOM G6 RECEIVER) DEVI Use to check glucose continuously.   Continuous Blood Gluc Sensor (DEXCOM G6 SENSOR) MISC Use to check glucose continuously. Change sensor every 10 days.   Continuous Blood Gluc Transmit (DEXCOM G6 TRANSMITTER) MISC Use to check glucose continuously. Replace every 3 months.   ibuprofen (ADVIL) 800 MG tablet Take 1 tablet (800 mg total) by mouth every 8 (eight) hours as needed.   medroxyPROGESTERone (DEPO-PROVERA) 150 MG/ML injection Inject 1 mL (150 mg total) into the muscle every 3 (three) months.   methylPREDNISolone (MEDROL) 4 MG TBPK tablet Take as directed on pack.   mupirocin ointment (BACTROBAN) 2 % Apply 1 Application topically 2 (two) times daily.   naproxen (NAPROSYN) 500 MG tablet Take 1 tablet (500 mg total) by mouth 2 (two) times daily.   promethazine-dextromethorphan (PROMETHAZINE-DM) 6.25-15 MG/5ML syrup Take 5 mLs by mouth every 4 (four) hours as needed.   sertraline (ZOLOFT) 25 MG tablet Take 1 tablet (25 mg total) by mouth daily.   traZODone (DESYREL) 50 MG tablet Take 0.5-1 tablets (25-50 mg total) by mouth at bedtime as needed for sleep.   Vitamin D, Ergocalciferol, (DRISDOL) 1.25 MG (50000 UNIT) CAPS capsule Take 1 capsule (50,000 Units total) by mouth every 7 (seven) days.   Accu-Chek FastClix Lancets MISC Use once daily as  directed (Patient not taking: Reported on 11/09/2021)   cephALEXin (KEFLEX) 500 MG capsule Take 1 capsule (500 mg total) by mouth 4 (four) times daily. (Patient not taking: Reported on 11/09/2021)   doxycycline (VIBRAMYCIN) 100 MG capsule Take 1 capsule (100 mg total) by mouth 2 (two) times daily. (Patient not taking: Reported on 11/09/2021)   Rimegepant Sulfate (NURTEC) 75 MG TBDP Take 1 tablet by mouth daily as needed. (Patient not taking: Reported on 12/24/2021)    No facility-administered medications prior to visit.    Review of Systems  HENT:  Positive for congestion, ear pain, rhinorrhea and sinus pain.   Respiratory:  Positive for cough.   Cardiovascular:  Positive for chest pain.  Musculoskeletal:  Positive for neck pain.  Neurological:  Positive for headaches.       Objective     Today's Vitals   12/24/21 1105  BP: 126/84  Pulse: (Abnormal) 111  Resp: 18  Temp: 98.3 F (36.8 C)  TempSrc: Oral  SpO2: 97%  Weight: 297 lb (134.7 kg)  Height: 5\' 6"  (1.676 m)   Body mass index is 47.94 kg/m.  BP Readings from Last 3 Encounters:  12/24/21 126/84  11/09/21 136/88  10/12/21 (Abnormal) 128/95    Wt Readings from Last 3 Encounters:  12/24/21 297 lb (134.7 kg)  11/09/21 (Abnormal) 301 lb (136.5 kg)  10/12/21 297 lb (134.7 kg)    Physical Exam Vitals and nursing note reviewed.  Constitutional:      Appearance: Normal appearance. She is well-developed. She is obese. She is ill-appearing.  HENT:     Head: Normocephalic and atraumatic.     Right Ear: Tympanic membrane is erythematous and bulging.     Left Ear: Tympanic membrane is erythematous and bulging.     Nose: Congestion present.     Right Sinus: Maxillary sinus tenderness and frontal sinus tenderness present.     Left Sinus: Maxillary sinus tenderness and frontal sinus tenderness present.     Mouth/Throat:     Pharynx: Posterior oropharyngeal erythema present.  Eyes:     Pupils: Pupils are equal, round, and reactive to light.  Cardiovascular:     Rate and Rhythm: Normal rate and regular rhythm.     Pulses: Normal pulses.     Heart sounds: Normal heart sounds.  Pulmonary:     Effort: Pulmonary effort is normal.     Breath sounds: Wheezing present.     Comments: Congested, non productive cough noted.  Abdominal:     Palpations: Abdomen is soft.  Musculoskeletal:        General: Normal range of motion.     Cervical back: Normal range of motion and neck supple.   Lymphadenopathy:     Cervical: Cervical adenopathy present.  Skin:    General: Skin is warm and dry.     Capillary Refill: Capillary refill takes less than 2 seconds.  Neurological:     General: No focal deficit present.     Mental Status: She is alert and oriented to person, place, and time.  Psychiatric:        Mood and Affect: Mood normal.        Behavior: Behavior normal.        Thought Content: Thought content normal.        Judgment: Judgment normal.      Assessment & Plan    1. Acute bronchitis, unspecified organism Only slight improvement of symptoms since visits to ER and Urgent Care. Rocephin 1 gm  and Solu-Medrol 125 mg injections were given in the office today. She was monitored for 20 minutes after receiving injections. She tolerated this well. Will complete FMLA paperwork for her once submitted.  - methylPREDNISolone sodium succinate (SOLU-MEDROL) 125 mg/2 mL injection 125 mg - cefTRIAXone (ROCEPHIN) injection 1 g  2. GAD (generalized anxiety disorder) Stable. Continue sertralnie 25 mg daily   3. Hypoglycemia Patient seeing endocrinology as scheduled.   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     Problem List Items Addressed This Visit       Respiratory   Acute bronchitis - Primary     Endocrine   Hypoglycemia     Musculoskeletal and Integument   Morphea scleroderma     Other   GAD (generalized anxiety disorder)     Return for prn worsening or persistent symptoms.         Ronnell Freshwater, NP  [facility] at [facility] [phone] (phone) [phone] (fax)  [place]

## 2022-01-08 ENCOUNTER — Other Ambulatory Visit (HOSPITAL_BASED_OUTPATIENT_CLINIC_OR_DEPARTMENT_OTHER): Payer: Self-pay

## 2022-01-08 MED ORDER — DEXCOM G6 SENSOR MISC
11 refills | Status: DC
  Filled 2022-01-08 – 2022-02-02 (×2): qty 3, 30d supply, fill #0

## 2022-01-11 ENCOUNTER — Other Ambulatory Visit (HOSPITAL_BASED_OUTPATIENT_CLINIC_OR_DEPARTMENT_OTHER): Payer: Self-pay

## 2022-01-13 ENCOUNTER — Encounter: Payer: Self-pay | Admitting: Nurse Practitioner

## 2022-01-17 DIAGNOSIS — J209 Acute bronchitis, unspecified: Secondary | ICD-10-CM | POA: Insufficient documentation

## 2022-01-18 ENCOUNTER — Other Ambulatory Visit: Payer: Self-pay

## 2022-01-22 ENCOUNTER — Ambulatory Visit (HOSPITAL_BASED_OUTPATIENT_CLINIC_OR_DEPARTMENT_OTHER): Payer: No Typology Code available for payment source

## 2022-01-25 ENCOUNTER — Other Ambulatory Visit (HOSPITAL_BASED_OUTPATIENT_CLINIC_OR_DEPARTMENT_OTHER): Payer: Self-pay

## 2022-01-26 ENCOUNTER — Ambulatory Visit (HOSPITAL_BASED_OUTPATIENT_CLINIC_OR_DEPARTMENT_OTHER): Payer: No Typology Code available for payment source | Admitting: Obstetrics & Gynecology

## 2022-01-28 ENCOUNTER — Other Ambulatory Visit (HOSPITAL_BASED_OUTPATIENT_CLINIC_OR_DEPARTMENT_OTHER): Payer: Self-pay

## 2022-01-28 ENCOUNTER — Encounter (HOSPITAL_BASED_OUTPATIENT_CLINIC_OR_DEPARTMENT_OTHER): Payer: Self-pay | Admitting: Obstetrics & Gynecology

## 2022-01-28 ENCOUNTER — Ambulatory Visit (HOSPITAL_BASED_OUTPATIENT_CLINIC_OR_DEPARTMENT_OTHER): Payer: No Typology Code available for payment source | Admitting: Obstetrics & Gynecology

## 2022-01-28 VITALS — BP 115/83 | HR 101 | Ht 66.0 in | Wt 303.0 lb

## 2022-01-28 DIAGNOSIS — Z862 Personal history of diseases of the blood and blood-forming organs and certain disorders involving the immune mechanism: Secondary | ICD-10-CM | POA: Diagnosis not present

## 2022-01-28 DIAGNOSIS — Z8742 Personal history of other diseases of the female genital tract: Secondary | ICD-10-CM | POA: Diagnosis not present

## 2022-01-28 MED ORDER — NORETHIN ACE-ETH ESTRAD-FE 1-20 MG-MCG PO TABS
1.0000 | ORAL_TABLET | Freq: Every day | ORAL | 0 refills | Status: DC
  Filled 2022-01-28: qty 84, 84d supply, fill #0

## 2022-01-28 NOTE — Progress Notes (Signed)
GYNECOLOGY  VISIT  CC:   desires changes in medication  HPI: 22 y.o. G0P0000 Single Black or [place] American female here for discussion of stopped depo provera and changing to another method for controlling cycles.  Doesn't bleed with Depo Provera but is frustred with weight gain.  Has increased about 20 pounds since starting this.  She was more physically active this last year.  She did have an IUD in the past but had a lot of cramping with this.  Has been on POPs in the past and felt sluggish.  Has also been on one combination pill.  Nexplanon discussed.  I am concerned she will have irregular bleeding with this.  H/o migraines.  No auras. No h/o DVT/PE.  No hypertension issues.     Past Medical History:  Diagnosis Date   Anemia    Anxiety disorder 03/25/2021   History of anemia    h/o iron infusions x 2   Localized scleroderma (morphea)    Lymphedema    Migraines     MEDS:   Current Outpatient Medications on File Prior to Visit  Medication Sig Dispense Refill   traZODone (DESYREL) 50 MG tablet Take 0.5-1 tablets (25-50 mg total) by mouth at bedtime as needed for sleep. 30 tablet 2   Accu-Chek FastClix Lancets MISC Use once daily as directed (Patient not taking: Reported on 11/09/2021) 102 each 0   albuterol (VENTOLIN HFA) 108 (90 Base) MCG/ACT inhaler Inhale 2 puffs into the lungs every 4 hours as needed. (Patient not taking: Reported on 01/28/2022) 8.5 g 0   Continuous Blood Gluc Receiver (DEXCOM G6 RECEIVER) DEVI Use to check glucose continuously. (Patient not taking: Reported on 01/28/2022) 1 each 1   Continuous Blood Gluc Sensor (DEXCOM G6 SENSOR) MISC Use to check glucose continuously. Change sensor every 10 days. (Patient not taking: Reported on 01/28/2022) 3 each 3   Continuous Blood Gluc Sensor (DEXCOM G6 SENSOR) MISC Inject 1 sensor to the skin every 10 days for continuous glucose monitoring. (Patient not taking: Reported on 01/28/2022) 3 each 11   Continuous Blood Gluc  Transmit (DEXCOM G6 TRANSMITTER) MISC Use to check glucose continuously. Replace every 3 months. (Patient not taking: Reported on 01/28/2022) 1 each 2   Rimegepant Sulfate (NURTEC) 75 MG TBDP Take 1 tablet by mouth daily as needed. (Patient not taking: Reported on 12/24/2021) 30 tablet 0   sertraline (ZOLOFT) 25 MG tablet Take 1 tablet (25 mg total) by mouth daily. (Patient not taking: Reported on 01/28/2022) 30 tablet 0   Vitamin D, Ergocalciferol, (DRISDOL) 1.25 MG (50000 UNIT) CAPS capsule Take 1 capsule (50,000 Units total) by mouth every 7 (seven) days. (Patient not taking: Reported on 01/28/2022) 4 capsule 0   No current facility-administered medications on file prior to visit.    ALLERGIES: Patient has no known allergies.  SH:  single, non smoker  Review of Systems  Constitutional: Negative.   Genitourinary: Negative.     PHYSICAL EXAMINATION:    BP 115/83 (BP Location: Left Arm, Patient Position: Sitting, Cuff Size: Large)   Pulse (!) 101   Ht 5\' 6"  (1.676 m) Comment: Reported  Wt (!) 303 lb (137.4 kg)   BMI 48.91 kg/m     General appearance: alert, cooperative and appears stated age   Assessment/Plan: 1. History of menorrhagia - will stop Depo Provera and transition to combination OCP.  Risks discussed with pt in detail including DUB, DVT/PE, headache, nausea, increased BP.  Will recheck BP in 3 months. -  norethindrone-ethinyl estradiol-FE (LOESTRIN FE) 1-20 MG-MCG tablet; Take 1 tablet by mouth daily.  Dispense: 84 tablet; Refill: 0  2. History of iron deficiency anemia  3. Morbid obesity ([place])

## 2022-02-02 ENCOUNTER — Other Ambulatory Visit (HOSPITAL_BASED_OUTPATIENT_CLINIC_OR_DEPARTMENT_OTHER): Payer: Self-pay

## 2022-02-12 NOTE — Telephone Encounter (Signed)
Please see the OV note.  Has the dates approved for leave. Ok to give pt work note based on those dates.

## 2022-02-15 NOTE — Telephone Encounter (Signed)
Per Nira Conn pt turned the paper work in with no name on it she will fill out the form 02/15/2022

## 2022-03-01 ENCOUNTER — Encounter: Payer: Self-pay | Admitting: Nurse Practitioner

## 2022-03-01 ENCOUNTER — Telehealth: Payer: Self-pay

## 2022-03-01 NOTE — Telephone Encounter (Signed)
Pt stopped by the office in hopes of getting a letter stated when she was seen 12/24/21 she was advised not to go to work until she was completely better. Pt states as a result of staying out of work she was terminated for absences.  Pt is now try to get unemployment and they are asking for documentation stating the pt was taken out of work from 12/24/21 to 01/04/22.  Please advise.  Pt states she has 48 hrs to turn in documentation. Due by noon on Wednesday 03/03/22

## 2022-03-01 NOTE — Telephone Encounter (Signed)
Per Tammy [name] note: Patient has been to Urgent Care twice  -first visit was Monday - was started on Z-pack -went back when chest pain was so significant on Tuesday.  --was then started on medrol, albuterol inhaler, and cough suppressant.  She states that she is not currently feeling any better.  --the patient has been out of work since Tuesday 12/22/2021 due to severity of symptoms.  I have recommended she stay out of work through tomorrow 12/25/2021. She is scheduled to return to work on Tuesday 12/29/2021.

## 2022-03-01 NOTE — Telephone Encounter (Signed)
Call pt to inform her that letter was ready. Pt is unable to see it in Mychart and will come pick up copy tomorrow.

## 2022-03-11 ENCOUNTER — Ambulatory Visit (INDEPENDENT_AMBULATORY_CARE_PROVIDER_SITE_OTHER): Payer: No Typology Code available for payment source | Admitting: Family Medicine

## 2022-03-11 ENCOUNTER — Other Ambulatory Visit (HOSPITAL_BASED_OUTPATIENT_CLINIC_OR_DEPARTMENT_OTHER): Payer: Self-pay

## 2022-03-11 VITALS — BP 118/71 | HR 92 | Temp 98.0°F | Ht 66.0 in | Wt 304.0 lb

## 2022-03-11 DIAGNOSIS — R632 Polyphagia: Secondary | ICD-10-CM | POA: Insufficient documentation

## 2022-03-11 DIAGNOSIS — Z6841 Body Mass Index (BMI) 40.0 and over, adult: Secondary | ICD-10-CM

## 2022-03-11 DIAGNOSIS — E559 Vitamin D deficiency, unspecified: Secondary | ICD-10-CM

## 2022-03-11 DIAGNOSIS — E162 Hypoglycemia, unspecified: Secondary | ICD-10-CM | POA: Diagnosis not present

## 2022-03-11 MED ORDER — ZEPBOUND 2.5 MG/0.5ML ~~LOC~~ SOAJ
2.5000 mg | SUBCUTANEOUS | 0 refills | Status: DC
  Filled 2022-03-11: qty 2, 28d supply, fill #0

## 2022-03-11 NOTE — Assessment & Plan Note (Signed)
Patient was previously using a continuous glucose monitor.  She has been highly concerned about low blood sugar readings and was seen by endocrinology at [place].  Her workup was negative.  She is doing a better job of eating on a schedule, avoiding high glycemic foods and incorporating lean protein and fiber with meals and snacks.  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  Will plan to recheck a fasting insulin level at her follow-up visit.

## 2022-03-11 NOTE — Assessment & Plan Note (Signed)
We discussed options for polyphasia.  Will focus on eating on a schedule and resuming category 4 meal plan.  She has started regular walking 30 minutes 7 times a week.  Begin tirzepatide 2.5 mg once weekly injection. Patient denies a personal or family history of pancreatitis, medullary thyroid carcinoma or multiple endocrine neoplasia type II. Recommend reviewing pen training video online.

## 2022-03-11 NOTE — Assessment & Plan Note (Signed)
Last vitamin D Lab Results  Component Value Date   VD25OH 19.5 (L) 08/13/2021    Patient was on prescription ergocalciferol once weekly but was lost to follow-up since August.  She has not been taking a vitamin D supplement.  She does complain of fatigue.  Recheck vitamin D level next visit.  She may remain on a women's multivitamin once daily.

## 2022-03-11 NOTE — Progress Notes (Signed)
Office: [phone]  /  Fax: [phone]  WEIGHT SUMMARY AND BIOMETRICS  Vitals Temp: 47 F (36.7 C) BP: 118/71 Pulse Rate: 92 SpO2: 97 %   Anthropometric Measurements Height: '5\' 6"'$  (1.676 m) Weight: (!) 304 lb (137.9 kg) BMI (Calculated): 49.09 Weight at Last Visit: 297lb Weight Lost Since Last Visit: +7 Starting Weight: 298lb Total Weight Loss (lbs): 0 lb (0 kg)   Body Composition  Body Fat %: 53.6 % Fat Mass (lbs): 163.4 lbs Muscle Mass (lbs): 134.2 lbs Total Body Water (lbs): 109.4 lbs Visceral Fat Rating : 16   Other Clinical Data Fasting: no Labs: no Today's Visit #: 4 Starting Date: 08/13/21     HPI  Chief Complaint: OBESITY  Tammy [name] is here to discuss her progress with her obesity treatment plan. She is on the the Category 4 Plan and states she is following her eating plan approximately 35 % of the time. She states she is exercising 30 minutes 7 times per week.   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 Body mass index is 49.07 kg/m.  General: She is overweight, cooperative, alert, well developed, and in no acute distress. PSYCH: Has normal mood, affect and thought process.   Lungs: Normal breathing effort, no conversational dyspnea.  DIAGNOSTIC DATA REVIEWED:  BMET    Component Value Date/Time   NA 142 06/09/2021 1413   K 4.0 06/09/2021 1413   CL 106 06/09/2021 1413   CO2 23 06/09/2021 1413   GLUCOSE 90 06/09/2021 1413   BUN 11 06/09/2021 1413   CREATININE 0.90  06/09/2021 1413   CALCIUM 9.5 06/09/2021 1413   Lab Results  Component Value Date   HGBA1C 4.5 (L) 06/09/2021   HGBA1C 4.5 (L) 02/27/2021   No results found for: "INSULIN" Lab Results  Component Value Date   TSH 2.140 06/09/2021   CBC    Component Value Date/Time   WBC 7.6 06/09/2021 1413   RBC 3.91 06/09/2021 1413   HGB 11.4 06/09/2021 1413   HCT 35.2 06/09/2021 1413   PLT 471 (H) 06/09/2021 1413   MCV 90 06/09/2021 1413   MCH 29.2 06/09/2021 1413   MCHC 32.4 06/09/2021 1413   RDW 12.1 06/09/2021 1413   Iron Studies    Component Value Date/Time   IRON 31 06/09/2021 1413   TIBC 310 06/09/2021 1413   FERRITIN 133 06/09/2021 1413   IRONPCTSAT 10 (L) 06/09/2021 1413   Lipid Panel     Component Value Date/Time   CHOL 156 08/13/2021 0932   TRIG 84 08/13/2021 0932   HDL 42 08/13/2021 0932   CHOLHDL 3.7 08/13/2021 0932   LDLCALC 98 08/13/2021 0932   Hepatic Function Panel     Component Value Date/Time   PROT 7.0 06/09/2021 1413   ALBUMIN 4.3 06/09/2021 1413   AST 13 06/09/2021 1413   ALT 12 06/09/2021 1413   ALKPHOS 70 06/09/2021 1413   BILITOT 0.5 06/09/2021 1413      Component  Value Date/Time   TSH 2.140 06/09/2021 1413   Nutritional Lab Results  Component Value Date   VD25OH 19.5 (L) 08/13/2021   VD25OH 21.5 (L) 02/27/2021     ASSESSMENT AND PLAN  TREATMENT PLAN FOR OBESITY:  Recommended Dietary Goals  Tammy [name] is currently in the action stage of change. As such, her goal is to continue weight management plan. She has agreed to the Category 4 Plan.  Behavioral Intervention  We discussed the following Behavioral Modification Strategies today: increasing lean protein intake, increasing vegetables, increasing water intake, work on meal planning and easy cooking plans, work on managing stress, creating time for self-care and relaxation measures, and avoiding temptations and identifying enticing environmental cues.  Additional resources provided  today: NA  Recommended Physical Activity Goals  Tammy [name] has been advised to work up to 150 minutes of moderate intensity aerobic activity a week and strengthening exercises 2-3 times per week for cardiovascular health, weight loss maintenance and preservation of muscle mass.   She has agreed to Will begin regular aerobic exercise 30 minutes, 5 times per week. Chosen activity walking or gym.   Pharmacotherapy We discussed various medication options to help Tammy [name] with her weight loss efforts and we both agreed to tirzepatide 2.5 mg once weekly.  ASSOCIATED CONDITIONS ADDRESSED TODAY  Polyphagia Assessment & Plan: We discussed options for polyphasia.  Will focus on eating on a schedule and resuming category 4 meal plan.  She has started regular walking 30 minutes 7 times a week.  Begin tirzepatide 2.5 mg once weekly injection. Patient denies a personal or family history of pancreatitis, medullary thyroid carcinoma or multiple endocrine neoplasia type II. Recommend reviewing pen training video online.    Morbid obesity ([place]) Assessment & Plan: Worsening Patient was lost to follow-up but is ready to get back on track.  She is currently looking for a new job.  Stress levels remain fairly high.  Barriers to progress include poor sleep.  A copy of category 4 meal plan was provided today with grocery shopping list     BMI 45.0-49.9, adult [facility])  Hypoglycemia Assessment & Plan: Patient was previously using a continuous glucose monitor.  She has been highly concerned about low blood sugar readings and was seen by endocrinology at [place].  Her workup was negative.  She is doing a better job of eating on a schedule, avoiding high glycemic foods and incorporating lean protein and fiber with meals and snacks.  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  Will plan to recheck a fasting insulin level at her follow-up visit.    Vitamin D deficiency Assessment &  Plan: Last vitamin D Lab Results  Component Value Date   VD25OH 19.5 (L) 08/13/2021    Patient was on prescription ergocalciferol once weekly but was lost to follow-up since August.  She has not been taking a vitamin D supplement.  She does complain of fatigue.  Recheck vitamin D level next visit.  She may remain on a women's multivitamin once daily.   Other orders -     Zepbound; Inject 2.5 mg into the skin once a week.  Dispense: 2 mL; Refill: 0      No follow-ups on file.Marland Kitchen She was informed of the importance of frequent follow up visits to maximize her success with intensive lifestyle modifications for her multiple health conditions.   ATTESTASTION STATEMENTS:  Reviewed by clinician on day of visit: allergies, medications, problem list, medical history, surgical history, family history, social history, and  previous encounter notes pertinent to obesity diagnosis.   I have personally spent 30 minutes total time today in preparation, patient care, nutritional counseling and documentation for this visit, including the following: review of clinical lab tests; review of medical tests/procedures/services.      Dell Ponto, DO DABFM, DABOM [facility] [address] [place], [place] [address] [phone]

## 2022-03-11 NOTE — Assessment & Plan Note (Addendum)
Worsening Patient was lost to follow-up but is ready to get back on track.  She is currently looking for a new job.  Stress levels remain fairly high.  Barriers to progress include poor sleep.  A copy of category 4 meal plan was provided today with grocery shopping list

## 2022-03-12 ENCOUNTER — Other Ambulatory Visit (HOSPITAL_BASED_OUTPATIENT_CLINIC_OR_DEPARTMENT_OTHER): Payer: Self-pay

## 2022-03-15 ENCOUNTER — Telehealth (INDEPENDENT_AMBULATORY_CARE_PROVIDER_SITE_OTHER): Payer: Self-pay | Admitting: *Deleted

## 2022-03-15 ENCOUNTER — Other Ambulatory Visit (HOSPITAL_BASED_OUTPATIENT_CLINIC_OR_DEPARTMENT_OTHER): Payer: Self-pay

## 2022-03-15 NOTE — Telephone Encounter (Signed)
Prior authorization done via cover my meds for patients Zepbound. Waiting on determination.  Milinda Antis (Key: [id number]) PA Case ID #: [id number]

## 2022-03-17 ENCOUNTER — Other Ambulatory Visit (HOSPITAL_BASED_OUTPATIENT_CLINIC_OR_DEPARTMENT_OTHER): Payer: Self-pay

## 2022-03-17 NOTE — Telephone Encounter (Signed)
Prior authorization approved for patients Zepbound. approved from 03/15/2022 to 11/15/2022 Patient notified.

## 2022-03-19 ENCOUNTER — Other Ambulatory Visit (HOSPITAL_COMMUNITY)
Admission: RE | Admit: 2022-03-19 | Discharge: 2022-03-19 | Disposition: A | Discharge status: Home / Self Care | Payer: BLUE CROSS/BLUE SHIELD | Source: Ambulatory Visit | Attending: Obstetrics & Gynecology | Admitting: Obstetrics & Gynecology

## 2022-03-19 ENCOUNTER — Encounter (HOSPITAL_BASED_OUTPATIENT_CLINIC_OR_DEPARTMENT_OTHER): Payer: Self-pay | Admitting: Obstetrics & Gynecology

## 2022-03-19 ENCOUNTER — Ambulatory Visit (HOSPITAL_BASED_OUTPATIENT_CLINIC_OR_DEPARTMENT_OTHER): Payer: No Typology Code available for payment source | Admitting: Obstetrics & Gynecology

## 2022-03-19 VITALS — BP 110/90 | HR 92 | Ht 66.0 in | Wt 304.2 lb

## 2022-03-19 DIAGNOSIS — Z124 Encounter for screening for malignant neoplasm of cervix: Secondary | ICD-10-CM | POA: Insufficient documentation

## 2022-03-19 DIAGNOSIS — Z8742 Personal history of other diseases of the female genital tract: Secondary | ICD-10-CM | POA: Diagnosis not present

## 2022-03-19 DIAGNOSIS — N912 Amenorrhea, unspecified: Secondary | ICD-10-CM

## 2022-03-19 DIAGNOSIS — Z01419 Encounter for gynecological examination (general) (routine) without abnormal findings: Secondary | ICD-10-CM | POA: Diagnosis not present

## 2022-03-19 DIAGNOSIS — Z113 Encounter for screening for infections with a predominantly sexual mode of transmission: Secondary | ICD-10-CM | POA: Insufficient documentation

## 2022-03-19 NOTE — Progress Notes (Signed)
22 y.o. [place] American female here for annual exam.  Was given OCPs earlier this year due to irregular bleeding.  Just restarted it two days ago.  Would like pregnancy test as well.  Hasn't been able to void this morning.   No LMP recorded. Patient has had an injection.          Sexually active: Yes.    The current method of family planning is condoms, sometimes.    Smoker:  no  Health Maintenance: Pap:  obtained today History of abnormal Pap:  no MMG:  guidelines reviewed Colonoscopy:  guidelines reviewed Screening Labs: done with [facility]   reports that she has never smoked. She has never used smokeless tobacco. She reports that she does not drink alcohol and does not use drugs.  Past Medical History:  Diagnosis Date   Anemia    Anxiety disorder 03/25/2021   History of anemia    h/o iron infusions x 2   Localized scleroderma (morphea)    Lymphedema    Migraines     Past Surgical History:  Procedure Laterality Date   TONSILLECTOMY      Current Outpatient Medications  Medication Sig Dispense Refill   norethindrone-ethinyl estradiol-FE (LOESTRIN FE) 1-20 MG-MCG tablet Take 1 tablet by mouth daily. 84 tablet 0   tirzepatide (ZEPBOUND) 2.5 MG/0.5ML Pen Inject 2.5 mg into the skin once a week. 2 mL 0   albuterol (VENTOLIN HFA) 108 (90 Base) MCG/ACT inhaler Inhale 2 puffs into the lungs every 4 hours as needed. (Patient not taking: Reported on 01/28/2022) 8.5 g 0   traZODone (DESYREL) 50 MG tablet Take 0.5-1 tablets (25-50 mg total) by mouth at bedtime as needed for sleep. (Patient not taking: Reported on 03/19/2022) 30 tablet 2   No current facility-administered medications for this visit.    Family History  Problem Relation Age of Onset   Breast cancer Maternal Grandmother    Hypertension Mother    Obesity Mother    Miscarriages / [place] Other    Cancer Other     ROS: Constitutional: negative Genitourinary:  amenorrhea  Exam:   BP (!) 110/90 (BP Location: Right Arm, Patient Position: Sitting, Cuff Size: Large)   Pulse 92   Ht 5\' 6"  (1.676 m) Comment: Reported  Wt (!) 304 lb 3.2 oz (138 kg)   BMI 49.10 kg/m   Height: 5\' 6"  (167.6 cm) (Reported)  General appearance: alert, cooperative and appears stated age Head: Normocephalic, without obvious abnormality, atraumatic Neck: no adenopathy, supple, symmetrical, trachea midline and thyroid normal to inspection and palpation Lungs: clear to auscultation bilaterally Breasts: normal appearance, no masses or tenderness Heart: regular rate and rhythm Abdomen: soft, non-tender; bowel sounds normal; no masses,  no organomegaly Extremities: extremities normal, atraumatic, no cyanosis or edema Skin: Skin color, texture, turgor normal. No rashes or lesions Lymph nodes: Cervical, supraclavicular, and axillary nodes normal. No abnormal inguinal nodes palpated Neurologic: Grossly normal   Pelvic: External genitalia:  no lesions              Urethra:  normal appearing urethra with no masses, tenderness or lesions              Bartholins and Skenes: normal                 Vagina: normal appearing vagina with normal color and no discharge, no lesions              Cervix:  no lesions              Pap taken: Yes.   Bimanual Exam:  Uterus:  normal size, contour, position, consistency, mobility, non-tender              Adnexa: no mass, fullness, tenderness               Rectovaginal: Confirms               Anus:  normal sphincter tone, no lesions  Chaperone, Octaviano Batty, CMA, was present for exam.  Assessment/Plan: 1. Well woman exam with routine gynecological exam - Pap smear obtained today - Mammogram guidelines reviewed - Colonoscopy guidelines reviewed - lab work done with [facility] - vaccines reviewed/updated  2. Cervical cancer screening - Cytology - PAP( [place]) - GC/Chl ordered with pap  3. Amenorrhea - Beta hCG  quant (ref lab)  4. History of irregular menstrual cycles  5. Morbid obesity ([place]) - on medication for treatment and being followed at [facility]

## 2022-03-20 LAB — BETA HCG QUANT (REF LAB): hCG Quant: <1 m[IU]/mL

## 2022-03-24 ENCOUNTER — Ambulatory Visit (HOSPITAL_BASED_OUTPATIENT_CLINIC_OR_DEPARTMENT_OTHER): Payer: No Typology Code available for payment source | Admitting: Family Medicine

## 2022-03-25 LAB — CYTOLOGY - PAP
Chlamydia: NEGATIVE
Comment: NEGATIVE
Comment: NORMAL
Diagnosis: NEGATIVE
Diagnosis: REACTIVE
Neisseria Gonorrhea: NEGATIVE

## 2022-03-26 ENCOUNTER — Encounter: Payer: Self-pay | Admitting: Plastic Surgery

## 2022-03-26 ENCOUNTER — Ambulatory Visit (INDEPENDENT_AMBULATORY_CARE_PROVIDER_SITE_OTHER): Payer: No Typology Code available for payment source | Admitting: Family Medicine

## 2022-03-26 ENCOUNTER — Encounter (HOSPITAL_BASED_OUTPATIENT_CLINIC_OR_DEPARTMENT_OTHER): Payer: Self-pay | Admitting: Family Medicine

## 2022-03-26 ENCOUNTER — Ambulatory Visit: Payer: No Typology Code available for payment source | Admitting: Plastic Surgery

## 2022-03-26 VITALS — BP 113/83 | HR 67 | Ht 66.0 in | Wt 305.6 lb

## 2022-03-26 VITALS — BP 138/90 | HR 67 | Ht 66.0 in | Wt 303.2 lb

## 2022-03-26 DIAGNOSIS — L940 Localized scleroderma [morphea]: Secondary | ICD-10-CM | POA: Diagnosis not present

## 2022-03-26 DIAGNOSIS — I890 Lymphedema, not elsewhere classified: Secondary | ICD-10-CM

## 2022-03-26 DIAGNOSIS — R030 Elevated blood-pressure reading, without diagnosis of hypertension: Secondary | ICD-10-CM

## 2022-03-26 DIAGNOSIS — M542 Cervicalgia: Secondary | ICD-10-CM

## 2022-03-26 DIAGNOSIS — Z6841 Body Mass Index (BMI) 40.0 and over, adult: Secondary | ICD-10-CM

## 2022-03-26 DIAGNOSIS — M549 Dorsalgia, unspecified: Secondary | ICD-10-CM | POA: Insufficient documentation

## 2022-03-26 DIAGNOSIS — Z8269 Family history of other diseases of the musculoskeletal system and connective tissue: Secondary | ICD-10-CM | POA: Diagnosis not present

## 2022-03-26 DIAGNOSIS — M546 Pain in thoracic spine: Secondary | ICD-10-CM

## 2022-03-26 DIAGNOSIS — E161 Other hypoglycemia: Secondary | ICD-10-CM

## 2022-03-26 DIAGNOSIS — G8929 Other chronic pain: Secondary | ICD-10-CM

## 2022-03-26 DIAGNOSIS — N62 Hypertrophy of breast: Secondary | ICD-10-CM | POA: Diagnosis not present

## 2022-03-26 DIAGNOSIS — Z803 Family history of malignant neoplasm of breast: Secondary | ICD-10-CM

## 2022-03-26 DIAGNOSIS — Z0000 Encounter for general adult medical examination without abnormal findings: Secondary | ICD-10-CM

## 2022-03-26 NOTE — Progress Notes (Signed)
Established Patient Office Visit  Subjective   Patient ID: Tammy [name], female    DOB: [birth year]  Age: 22 y.o. MRN: [id number]  Hyperinsulinemia-   Recurrent hypoglycemia started in Feb/March 2023. Prior to this, due to intermittent fatigue, she underwent lab work in 02/2021 which showed elevated free insulin of 39 on 02/23/2021. Repeat labs on 03/04/2021 showed even higher free insulin of 73. Her hemoglobin A1c was 4.5% in 06/2021. Documented hypoglycemia in the 50s with CGM several times per week. Early, NP ordered CT scan (completed 04/2021) for possible insulinoma. Referred to endocrinology.  Was seen by [facility] on 04/28/2021 but wanted second opinion so didn't follow up with this provider. Most recently, 01/08/2022 saw provider at [facility]. 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 Performed fasting CMP, insulin, C-peptide with no hypoglycemia after fasting for 14 hours. She was not pleased with this endocrinologist either, since they did not give her an answer for what is causing her hyperinsulinemia. Currently does not have CGM, since transmitter becomes defective when it gets wet.   Not checking BGL at home. No recent sensations/events of hypoglycemia. They usually occur in her sleep.  Would like [facility] referral. She would like to understand the cause of the hyperinsulinemia.  [facility] said insulin resistance but did not explain cause.   Still seeing [facility]: 08/13/2021, 08/27/2021, 10/12/2021, 03/11/2022 03/11/22: started tirzepatide 2.5mg  once weekly-- now seeing them monthly for dose adjustments First week was "weird" on Zepbound- had to force herself to eat. No feeling of hunger for 8 hours. Mild nausea occasionally. Trying to go to the gym often but has been slacking this past week.   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 Negative for weight loss.  Eyes:  Negative for blurred vision and double vision.  Respiratory:  Negative for cough and shortness of breath.   Cardiovascular:  Negative for chest pain.  Gastrointestinal:  Negative for abdominal pain, nausea and vomiting.  Musculoskeletal:  Negative for myalgias.  Neurological:  Negative for dizziness and headaches.      Objective:     BP (!) 138/90   Pulse 67   Ht 5\' 6"  (1.676 m)   Wt (!) 303 lb 3.2 oz (137.5 kg)   SpO2 96%   BMI 48.94 kg/m  BP Readings from Last 3 Encounters:  03/26/22 (!) 138/90  03/19/22 (!) 110/90  03/11/22 118/71     Physical Exam Constitutional:      Appearance: Normal appearance. She is obese.  Cardiovascular:     Rate and Rhythm: Normal rate and regular rhythm.     Heart sounds: Normal heart sounds.  Pulmonary:     Effort: Pulmonary effort is normal.     Breath sounds: Normal breath sounds.  Neurological:     Mental Status: She is alert.  Psychiatric:        Mood and Affect: Mood normal.        Behavior: Behavior normal.        Thought Content: Thought content normal.        Judgment: Judgment normal.       Assessment & Plan:   1. Hyperinsulinemia Reviewed extensive history regarding work-up for insulin resistance. Patient has seen two different endocrinologists and has not felt that she has received an answer for the cause  of her hyperinsulinemia/insulin resistance. She would like to be referred to [facility]. She is not currently monitoring her blood glucose levels, reporting that the [facility] transmitter would become defective after exposure to water. She is currently taking tirzepatide (Zepbound) 2.5mg  weekly with minimal symptoms. She is currently being followed by [facility] for weight loss management- sees them monthly for dose adjustments. Referral for [facility]. placed today. No  labs performed today.   - Ambulatory referral to Endocrinology  2. Family history of fibromyalgia Patient has never received a diagnosis of fibromyalgia but reports chronic widespread musculoskeletal pain that is accompanied by fatigue. She does have a family history of fibromyalgia. She would like to establish with [facility] for further assessment and management. Referral for [facility]. placed today.  - Ambulatory referral to Rheumatology  3. Elevated BP without diagnosis of hypertension Patient has elevated diastolic BP in office today. Denies chest pain, shortness of breath, dizziness, headache, weakness, or any acute symptoms. Patient is in no distress today. Will continue to watch her BP in the office. Would like it to be <120/<80 based on her age.   Return in about 3 months (around 06/26/2022) for Physical with fasting labs. Will obtain fasting labs prior to physical.   Spent 30 minutes on this patient encounter, including preparation, chart review, review of specialists plan of care, face-to-face counseling with patient and coordination of care, and documentation of encounter.    Les Pou, FNP

## 2022-03-26 NOTE — Progress Notes (Signed)
Patient ID: Tammy [name], female    DOB: 07/13/2000, 22 y.o.   MRN: [id number]   Chief Complaint  Patient presents with   Consult        Breast Problem    Mammary Hyperplasia: The patient is a 22 y.o. female with a history of mammary hyperplasia for several years.  She has extremely large breasts causing symptoms that include the following: Back pain in the upper and lower back, including neck pain. She pulls or pins her bra straps to provide better lift and relief of the pressure and pain. She notices relief by holding her breast up manually.  Her shoulder straps cause grooves and pain and pressure that requires padding for relief. Pain medication is sometimes required with motrin and tylenol.  Activities that are hindered by enlarged breasts include: exercise and running.  She has tried supportive clothing as well as fitted bras without improvement.  Her breasts are extremely large and fairly symmetric.  She has hyperpigmentation of the inframammary area on both sides.  The sternal to nipple distance on the right is 42 cm and the left is 42 cm.  The IMF distance is 20 cm.  She is 5 feet 6 inches tall and weighs 305 pounds.  The BMI = 49.2 kg/m.  Preoperative bra size = E cup.  She would like to be a C or D cup. The estimated excess breast tissue to be removed at the time of surgery = 1000 grams on the left and 1000 grams on the right.  Mammogram history: not indicated.  Family history of breast cancer:  maternal grandmother.  Tobacco use:  none.   The patient expresses the desire to pursue surgical intervention.  She is currently working with a [facility] and on medication for weight reduction.     Review of Systems  Constitutional: Negative.   HENT: Negative.    Eyes: Negative.   Respiratory: Negative.  Negative for chest tightness and shortness of breath.   Cardiovascular: Negative.   Gastrointestinal: Negative.   Endocrine: Negative.   Genitourinary:  Negative.   Musculoskeletal: Negative.     Past Medical History:  Diagnosis Date   Anemia    Anxiety disorder 03/25/2021   History of anemia    h/o iron infusions x 2   Localized scleroderma (morphea)    Lymphedema    Migraines     Past Surgical History:  Procedure Laterality Date   TONSILLECTOMY        Current Outpatient Medications:    norethindrone-ethinyl estradiol-FE (LOESTRIN FE) 1-20 MG-MCG tablet, Take 1 tablet by mouth daily., Disp: 84 tablet, Rfl: 0   tirzepatide (ZEPBOUND) 2.5 MG/0.5ML Pen, Inject 2.5 mg into the skin once a week. (Patient taking differently: Inject 2.5 mg into the skin once a week. On thursday's), Disp: 2 mL, Rfl: 0   traZODone (DESYREL) 50 MG tablet, Take 0.5-1 tablets (25-50 mg total) by mouth at bedtime as needed for sleep., Disp: 30 tablet, Rfl: 2   Objective:   Vitals:   03/26/22 1339  BP: 113/83  Pulse: 67  SpO2: 97%    Physical Exam Constitutional:      Appearance: Normal appearance.  HENT:     Head: Normocephalic and atraumatic.  Cardiovascular:     Rate and Rhythm: Normal rate.     Pulses: Normal pulses.  Pulmonary:     Effort: Pulmonary effort is normal.  Abdominal:     General: There is no distension.  Palpations: Abdomen is soft.     Tenderness: There is no abdominal tenderness.  Musculoskeletal:        General: No swelling or deformity.  Skin:    General: Skin is warm.     Capillary Refill: Capillary refill takes less than 2 seconds.     Coloration: Skin is not jaundiced.     Findings: No bruising or lesion.  Neurological:     Mental Status: She is alert and oriented to person, place, and time.  Psychiatric:        Mood and Affect: Mood normal.        Behavior: Behavior normal.        Thought Content: Thought content normal.        Judgment: Judgment normal.     Assessment & Plan:  BMI 45.0-49.9, adult ([facility])  Morphea scleroderma  Lymphedema praecox  Morbid obesity ([place])  Chronic bilateral thoracic  back pain  Symptomatic mammary hypertrophy  The procedure the patient selected and that was best for the patient was discussed. The risk were discussed and include but not limited to the following:  Breast asymmetry, fluid accumulation, firmness of the breast, inability to breast feed, loss of nipple or areola, skin loss, change in skin and nipple sensation, fat necrosis of the breast tissue, bleeding, infection and healing delay.  There are risks of anesthesia and injury to nerves or blood vessels.  Allergic reaction to tape, suture and skin glue are possible.  There will be swelling.  Any of these can lead to the need for revisional surgery.  A breast reduction has potential to interfere with diagnostic procedures in the future.  This procedure is best done when the breast is fully developed.  Changes in the breast will continue to occur over time: pregnancy, weight gain or weigh loss.    Total time: 40 minutes. This includes time spent with the patient during the visit as well as time spent before and after the visit reviewing the chart, documenting the encounter, ordering pertinent studies and literature for the patient.   Physical therapy:  not required Mammogram:  not indicated [facility]:  currently a patient  The patient is a candidate for bilateral breast reduction with liposuction.  I am concerned about her scleroderma diagnosis so we will work with her primary care or her rheumatologist before any surgery.  We will also need to get her weight down to closer to her ideal which is between 202 50.  I would like to see her back in a couple of months.  Pictures were obtained of the patient and placed in the chart with the patient's or guardian's permission.   [place], DO

## 2022-03-30 ENCOUNTER — Encounter (HOSPITAL_BASED_OUTPATIENT_CLINIC_OR_DEPARTMENT_OTHER): Payer: Self-pay

## 2022-03-31 ENCOUNTER — Encounter (HOSPITAL_BASED_OUTPATIENT_CLINIC_OR_DEPARTMENT_OTHER): Payer: Self-pay

## 2022-04-12 ENCOUNTER — Ambulatory Visit (INDEPENDENT_AMBULATORY_CARE_PROVIDER_SITE_OTHER): Payer: No Typology Code available for payment source | Admitting: Family Medicine

## 2022-04-12 ENCOUNTER — Other Ambulatory Visit (HOSPITAL_BASED_OUTPATIENT_CLINIC_OR_DEPARTMENT_OTHER): Payer: Self-pay

## 2022-04-12 ENCOUNTER — Encounter (INDEPENDENT_AMBULATORY_CARE_PROVIDER_SITE_OTHER): Payer: Self-pay | Admitting: Family Medicine

## 2022-04-12 VITALS — BP 123/66 | HR 111 | Temp 98.5°F | Ht 66.0 in | Wt 297.0 lb

## 2022-04-12 DIAGNOSIS — R1013 Epigastric pain: Secondary | ICD-10-CM | POA: Diagnosis not present

## 2022-04-12 DIAGNOSIS — E559 Vitamin D deficiency, unspecified: Secondary | ICD-10-CM

## 2022-04-12 DIAGNOSIS — Z6841 Body Mass Index (BMI) 40.0 and over, adult: Secondary | ICD-10-CM

## 2022-04-12 DIAGNOSIS — E161 Other hypoglycemia: Secondary | ICD-10-CM | POA: Diagnosis not present

## 2022-04-12 MED ORDER — ZEPBOUND 5 MG/0.5ML ~~LOC~~ SOAJ
5.0000 mg | SUBCUTANEOUS | 0 refills | Status: DC
  Filled 2022-04-12: qty 2, 28d supply, fill #0

## 2022-04-12 MED ORDER — FAMOTIDINE 40 MG PO TABS
40.0000 mg | ORAL_TABLET | Freq: Every day | ORAL | 0 refills | Status: DC
  Filled 2022-04-12: qty 30, 30d supply, fill #0

## 2022-04-12 NOTE — Assessment & Plan Note (Signed)
Last vitamin D Lab Results  Component Value Date   VD25OH 19.5 (L) 08/13/2021   She is off RX vitamin D and labs have been ordered by PCP to have drawn in June including Vitamin D level.  Advised her to start OTC Vitamin D 2,000 IU once daily.  Keep upcoming lab visit.

## 2022-04-12 NOTE — Assessment & Plan Note (Signed)
Worsened by start of Zepbound  Plan: begin Famotidine 40 mg once daily; avoid overeating, greasy foods and excess sugar. Avoid late night eating due to delayed gastric emptying from Zepbound

## 2022-04-12 NOTE — Progress Notes (Signed)
Office: [phone]  /  Fax: [phone]  WEIGHT SUMMARY AND BIOMETRICS  Starting Date: 08/13/21  Starting Weight: 298lb   Weight Lost Since Last Visit: 7lb   Vitals Temp: 98.5 F (36.9 C) BP: 123/66 Pulse Rate: (!) 111 SpO2: 100 %   Body Composition  Body Fat %: 52.3 % Fat Mass (lbs): 155.6 lbs Muscle Mass (lbs): 134.8 lbs Total Body Water (lbs): 106 lbs Visceral Fat Rating : 15   HPI  Chief Complaint: OBESITY  Tammy [name] is here to discuss her progress with her obesity treatment plan. She is on the the Category 4 Plan and states she is following her eating plan approximately 60 % of the time. She states she is exercising 60 minutes 3 times per week.  Interval History:  Since last office visit she is down 7 lb Maintained her muscle mass She did 4 weeks of Zepbound 2.5 mg weekly She had some GERD and heartbutn She is not taking anything for reflux She is not eating past her fullness cues She denies constipation She is starting a new job next week  Pharmacotherapy: Zepbound 2.5 mg weekly  PHYSICAL EXAM:  Blood pressure 123/66, pulse (!) 111, temperature 98.5 F (36.9 C), height 5\' 6"  (1.676 m), weight 297 lb (134.7 kg), SpO2 100 %. Body mass index is 47.94 kg/m.  General: She is overweight, cooperative, alert, well developed, and in no acute distress. PSYCH: Has normal mood, affect and thought process.   Lungs: Normal breathing effort, no conversational dyspnea.   ASSESSMENT AND PLAN  TREATMENT PLAN FOR OBESITY:  Recommended Dietary Goals  Tammy [name] is currently in the action stage of change. As such, her goal is to continue weight management plan. She has agreed to the Category 4 Plan.  Behavioral Intervention  We discussed the following Behavioral Modification Strategies today: increasing lean protein intake, increasing vegetables, avoiding skipping meals, increasing water intake, work on meal planning and preparation, reading food labels , work on  managing stress, creating time for self-care and relaxation measures, and planning for success.  Additional resources provided today: NA  Recommended Physical Activity Goals  Tammy [name] has been advised to work up to 150 minutes of moderate intensity aerobic activity a week and strengthening exercises 2-3 times per week for cardiovascular health, weight loss maintenance and preservation of muscle mass.   She has agreed to Think about ways to increase physical activity  Pharmacotherapy changes for the treatment of obesity: Increase Zepbound to 5 mg weekly injection  ASSOCIATED CONDITIONS ADDRESSED TODAY  Dyspepsia Assessment & Plan: Worsened by start of Zepbound  Plan: begin Famotidine 40 mg once daily; avoid overeating, greasy foods and excess sugar. Avoid late night eating due to delayed gastric emptying from Zepbound  Orders: -     Famotidine; Take 1 tablet (40 mg total) by mouth at bedtime.  Dispense: 30 tablet; Refill: 0  Morbid obesity -     Zepbound; Inject 5 mg into the skin once a week.  Dispense: 2 mL; Refill: 0  BMI 45.0-49.9, adult  Hyperinsulinemia Assessment & Plan: This has contributed her there problems with hypoglycemia and she has had negative workups from a few different endocrinologists.  She has monitored her blood sugars in the past.  Plan: Follow up with PCP.  Work on insulin sensitivity by adding in regular exercise, > 30 min 3-5 days/ wk, avoiding excess intake of refined carbs and sugar and improve eating schedule to q 4 hrs including lean protein/ fiber/ healthy fats with meals and  snacks to help stabilize blood sugars.   Vitamin D deficiency Assessment & Plan: Last vitamin D Lab Results  Component Value Date   VD25OH 19.5 (L) 08/13/2021   She is off RX vitamin D and labs have been ordered by PCP to have drawn in June including Vitamin D level.  Advised her to start OTC Vitamin D 2,000 IU once daily.  Keep upcoming lab visit.        She was  informed of the importance of frequent follow up visits to maximize her success with intensive lifestyle modifications for her multiple health conditions.   ATTESTASTION STATEMENTS:  Reviewed by clinician on day of visit: allergies, medications, problem list, medical history, surgical history, family history, social history, and previous encounter notes pertinent to obesity diagnosis.   I have personally spent 30 minutes total time today in preparation, patient care, nutritional counseling and documentation for this visit, including the following: review of clinical lab tests; review of medical tests/procedures/services.      Tammy Brink, DO DABFM, DABOM [facility] [address] [place], [place] [address] [phone]

## 2022-04-12 NOTE — Assessment & Plan Note (Signed)
This has contributed her there problems with hypoglycemia and she has had negative workups from a few different endocrinologists.  She has monitored her blood sugars in the past.  Plan: Follow up with PCP.  Work on insulin sensitivity by adding in regular exercise, > 30 min 3-5 days/ wk, avoiding excess intake of refined carbs and sugar and improve eating schedule to q 4 hrs including lean protein/ fiber/ healthy fats with meals and snacks to help stabilize blood sugars.

## 2022-04-13 ENCOUNTER — Other Ambulatory Visit (HOSPITAL_BASED_OUTPATIENT_CLINIC_OR_DEPARTMENT_OTHER): Payer: Self-pay

## 2022-04-13 ENCOUNTER — Other Ambulatory Visit: Payer: Self-pay

## 2022-04-15 ENCOUNTER — Ambulatory Visit (INDEPENDENT_AMBULATORY_CARE_PROVIDER_SITE_OTHER): Payer: No Typology Code available for payment source | Admitting: Family Medicine

## 2022-05-11 ENCOUNTER — Ambulatory Visit (INDEPENDENT_AMBULATORY_CARE_PROVIDER_SITE_OTHER): Payer: No Typology Code available for payment source | Admitting: Family Medicine

## 2022-05-12 ENCOUNTER — Ambulatory Visit (INDEPENDENT_AMBULATORY_CARE_PROVIDER_SITE_OTHER): Payer: No Typology Code available for payment source | Admitting: Family Medicine

## 2022-05-12 ENCOUNTER — Other Ambulatory Visit (HOSPITAL_BASED_OUTPATIENT_CLINIC_OR_DEPARTMENT_OTHER): Payer: Self-pay

## 2022-05-12 VITALS — BP 135/85 | HR 99 | Temp 98.9°F | Ht 66.0 in | Wt 293.0 lb

## 2022-05-12 DIAGNOSIS — Z6841 Body Mass Index (BMI) 40.0 and over, adult: Secondary | ICD-10-CM

## 2022-05-12 DIAGNOSIS — E559 Vitamin D deficiency, unspecified: Secondary | ICD-10-CM

## 2022-05-12 DIAGNOSIS — R632 Polyphagia: Secondary | ICD-10-CM

## 2022-05-12 DIAGNOSIS — R1013 Epigastric pain: Secondary | ICD-10-CM | POA: Diagnosis not present

## 2022-05-12 MED ORDER — FAMOTIDINE 40 MG PO TABS
40.0000 mg | ORAL_TABLET | Freq: Every day | ORAL | 0 refills | Status: DC
  Filled 2022-05-12 – 2022-06-15 (×3): qty 30, 30d supply, fill #0

## 2022-05-12 MED ORDER — ZEPBOUND 7.5 MG/0.5ML ~~LOC~~ SOAJ
7.5000 mg | SUBCUTANEOUS | 0 refills | Status: DC
  Filled 2022-05-12: qty 2, 28d supply, fill #0

## 2022-05-12 NOTE — Progress Notes (Signed)
Office: [phone]  /  Fax: [phone]  WEIGHT SUMMARY AND BIOMETRICS  Starting Date: 08/13/21  Starting Weight: 298lb   Weight Lost Since Last Visit: 4lb   Vitals Temp: 98.9 F (37.2 C) BP: 135/85 Pulse Rate: 99 SpO2: 98 %   Body Composition  Body Fat %: 45.9 % Fat Mass (lbs): 134.8 lbs Muscle Mass (lbs): 151 lbs Total Body Water (lbs): 117.6 lbs Visceral Fat Rating : 13    HPI  Chief Complaint: OBESITY  Tammy [name] is here to discuss her progress with her obesity treatment plan. She is on the the Category 4 Plan and states she is following her eating plan approximately 60 % of the time. She states she is exercising 30-60 minutes 1 times per week.   Interval History:  Since last office visit she is down 4 lb She started a new job and this has been stressful She is sedentary and has not been going to the gym as much She did increase her Zepbound to 5 mg dose- did all 4 injections She denies nausea and Famotidine did help with dyspepsia She is under financial strain She cannot get in all the food on her meal plan She and her mom need to move soon  Pharmacotherapy: Zepbound 5 mg weekly  PHYSICAL EXAM:  Blood pressure 135/85, pulse 99, temperature 98.9 F (37.2 C), height 5\' 6"  (1.676 m), weight 293 lb (132.9 kg), SpO2 98 %. Body mass index is 47.29 kg/m.  General: She is overweight, cooperative, alert, well developed, and in no acute distress. PSYCH: Has normal mood, affect and thought process.   Lungs: Normal breathing effort, no conversational dyspnea.   ASSESSMENT AND PLAN  TREATMENT PLAN FOR OBESITY:  Recommended Dietary Goals  Ola is currently in the action stage of change. As such, her goal is to continue weight management plan. She has agreed to the Category 3 Plan.  Changed from Cat 4.  Eating out guide given along with calorie counts for each meal  Behavioral Intervention  We discussed the following Behavioral Modification Strategies  today: increasing lean protein intake, decreasing simple carbohydrates , increasing vegetables, increasing lower glycemic fruits, increasing water intake, continue to practice mindfulness when eating, and planning for success.  Additional resources provided today: NA  Recommended Physical Activity Goals  Naydean has been advised to work up to 150 minutes of moderate intensity aerobic activity a week and strengthening exercises 2-3 times per week for cardiovascular health, weight loss maintenance and preservation of muscle mass.   She has agreed to Work on scheduling and tracking physical activity.   Pharmacotherapy changes for the treatment of obesity:  Zepbound increased to 7.5 mg weekly  ASSOCIATED CONDITIONS ADDRESSED TODAY  Polyphagia Assessment & Plan: Improving on Zepbound with improved satiety Dyspepsia SE improved on Famotodine daily Doing better with meal planning, listening to full cues and getting lean protein with meals/ snacks  Increase Zepbound to 7.5 mg weekly  Orders: -     Zepbound; Inject 7.5 mg into the skin once a week.  Dispense: 2 mL; Refill: 0  Dyspepsia Assessment & Plan: Improving on Famotidine 40 mg at bedtime for dyspepsia due to Zepbound Avoid late night eating and large portion sizes  Orders: -     Famotidine; Take 1 tablet (40 mg total) by mouth at bedtime.  Dispense: 30 tablet; Refill: 0  Morbid obesity (HCC) with starting BMI 48 Assessment & Plan: Net weight loss if 5 lb in 9 mos of medically supervised weight management  Psychosocial stressors have been a barrier to her progress Her % body fat is dropping and she seems to be responding well to Zepbound  Continue to work on stress reduction, eating on a schedule, lean protein and fiber with meals, meal planning and recommend giving herself more options for exercise to make it more enjoyable.   BMI 45.0-49.9, adult (HCC)  Vitamin D deficiency Assessment & Plan: Last vitamin D Lab Results   Component Value Date   VD25OH 19.5 (L) 08/13/2021   Doing well on OTC vitamin D 5,000 IU daily and will have lab updated by PCP in June       She was informed of the importance of frequent follow up visits to maximize her success with intensive lifestyle modifications for her multiple health conditions.   ATTESTASTION STATEMENTS:  Reviewed by clinician on day of visit: allergies, medications, problem list, medical history, surgical history, family history, social history, and previous encounter notes pertinent to obesity diagnosis.   I have personally spent 30 minutes total time today in preparation, patient care, nutritional counseling and documentation for this visit, including the following: review of clinical lab tests; review of medical tests/procedures/services.      Glennis Brink, DO DABFM, DABOM [facility] [address] [place], [place] [address] [phone]

## 2022-05-12 NOTE — Assessment & Plan Note (Signed)
Improving on Famotidine 40 mg at bedtime for dyspepsia due to Zepbound Avoid late night eating and large portion sizes

## 2022-05-12 NOTE — Assessment & Plan Note (Signed)
Net weight loss if 5 lb in 9 mos of medically supervised weight management Psychosocial stressors have been a barrier to her progress Her % body fat is dropping and she seems to be responding well to Zepbound  Continue to work on stress reduction, eating on a schedule, lean protein and fiber with meals, meal planning and recommend giving herself more options for exercise to make it more enjoyable.

## 2022-05-12 NOTE — Assessment & Plan Note (Signed)
Last vitamin D Lab Results  Component Value Date   VD25OH 19.5 (L) 08/13/2021   Doing well on OTC vitamin D 5,000 IU daily and will have lab updated by PCP in June

## 2022-05-12 NOTE — Assessment & Plan Note (Signed)
Improving on Zepbound with improved satiety Dyspepsia SE improved on Famotodine daily Doing better with meal planning, listening to full cues and getting lean protein with meals/ snacks  Increase Zepbound to 7.5 mg weekly

## 2022-05-13 ENCOUNTER — Other Ambulatory Visit: Payer: Self-pay

## 2022-05-13 ENCOUNTER — Other Ambulatory Visit (HOSPITAL_BASED_OUTPATIENT_CLINIC_OR_DEPARTMENT_OTHER): Payer: Self-pay

## 2022-05-20 ENCOUNTER — Telehealth (HOSPITAL_BASED_OUTPATIENT_CLINIC_OR_DEPARTMENT_OTHER): Payer: Self-pay | Admitting: Obstetrics & Gynecology

## 2022-05-20 NOTE — Telephone Encounter (Signed)
Patient came by the office and stated she has a  lump on her left side.  Patient wanted to know can she come in  at 4pm today or Tuesday before 10am.

## 2022-05-20 NOTE — Telephone Encounter (Signed)
Pt states that she has a tender lump on her left labia. Pt provided with appt for evaluation.

## 2022-05-21 ENCOUNTER — Ambulatory Visit (HOSPITAL_BASED_OUTPATIENT_CLINIC_OR_DEPARTMENT_OTHER): Payer: No Typology Code available for payment source | Admitting: Obstetrics & Gynecology

## 2022-05-21 ENCOUNTER — Encounter (HOSPITAL_BASED_OUTPATIENT_CLINIC_OR_DEPARTMENT_OTHER): Payer: Self-pay | Admitting: Obstetrics & Gynecology

## 2022-05-21 ENCOUNTER — Other Ambulatory Visit (HOSPITAL_BASED_OUTPATIENT_CLINIC_OR_DEPARTMENT_OTHER): Payer: Self-pay

## 2022-05-21 VITALS — BP 119/58 | HR 78 | Ht 66.0 in | Wt 298.4 lb

## 2022-05-21 DIAGNOSIS — L0292 Furuncle, unspecified: Secondary | ICD-10-CM

## 2022-05-21 MED ORDER — SULFAMETHOXAZOLE-TRIMETHOPRIM 800-160 MG PO TABS
1.0000 | ORAL_TABLET | Freq: Two times a day (BID) | ORAL | 0 refills | Status: DC
  Filled 2022-05-21: qty 14, 7d supply, fill #0

## 2022-05-21 NOTE — Progress Notes (Signed)
GYNECOLOGY  VISIT  CC:   vulvar cyst3  HPI: 22 y.o. G0P0000 Single Black or African American female here for painful vulvar lesion that has been present for about a week.  No drainage.  No fever.  Is tender to the touch.  Has had some boils the last month that have resolved.  This has not.     Past Medical History:  Diagnosis Date   Anemia    Anxiety disorder 03/25/2021   History of anemia    h/o iron infusions x 2   Localized scleroderma (morphea)    Lymphedema    Migraines     MEDS:   Current Outpatient Medications on File Prior to Visit  Medication Sig Dispense Refill   famotidine (PEPCID) 40 MG tablet Take 1 tablet (40 mg total) by mouth at bedtime. 30 tablet 0   norethindrone-ethinyl estradiol-FE (LOESTRIN FE) 1-20 MG-MCG tablet Take 1 tablet by mouth daily. 84 tablet 0   tirzepatide (ZEPBOUND) 7.5 MG/0.5ML Pen Inject 7.5 mg into the skin once a week. 2 mL 0   traZODone (DESYREL) 50 MG tablet Take 0.5-1 tablets (25-50 mg total) by mouth at bedtime as needed for sleep. 30 tablet 2   No current facility-administered medications on file prior to visit.    ALLERGIES: Patient has no known allergies.  SH:  single, non smoker  Review of Systems  Constitutional: Negative.     PHYSICAL EXAMINATION:    BP (!) 119/58   Pulse 78   Ht 5\' 6"  (1.676 m)   Wt 298 lb 6.4 oz (135.4 kg)   LMP 05/07/2022 (Approximate)   BMI 48.16 kg/m     General appearance: alert, cooperative and appears stated age Lymph:  no inguinal LAD noted  Pelvic: External genitalia:  2cm, oblong, firm lesion on left mons, tender to palpation, no erythema                Assessment/Plan: 1. Furuncle - sulfamethoxazole-trimethoprim (BACTRIM DS) 800-160 MG tablet; Take 1 tablet by mouth 2 (two) times daily.  Dispense: 14 tablet; Refill: 0 - warm compresses two to three times daily recommended as well

## 2022-05-22 ENCOUNTER — Other Ambulatory Visit (HOSPITAL_BASED_OUTPATIENT_CLINIC_OR_DEPARTMENT_OTHER): Payer: Self-pay

## 2022-05-24 ENCOUNTER — Ambulatory Visit (HOSPITAL_BASED_OUTPATIENT_CLINIC_OR_DEPARTMENT_OTHER): Payer: No Typology Code available for payment source | Admitting: Obstetrics & Gynecology

## 2022-05-25 ENCOUNTER — Ambulatory Visit (INDEPENDENT_AMBULATORY_CARE_PROVIDER_SITE_OTHER): Payer: No Typology Code available for payment source | Admitting: Family Medicine

## 2022-05-27 ENCOUNTER — Other Ambulatory Visit (HOSPITAL_BASED_OUTPATIENT_CLINIC_OR_DEPARTMENT_OTHER): Payer: Self-pay

## 2022-05-27 ENCOUNTER — Encounter (HOSPITAL_BASED_OUTPATIENT_CLINIC_OR_DEPARTMENT_OTHER): Payer: Self-pay | Admitting: Obstetrics & Gynecology

## 2022-06-07 ENCOUNTER — Other Ambulatory Visit (HOSPITAL_BASED_OUTPATIENT_CLINIC_OR_DEPARTMENT_OTHER): Payer: Self-pay

## 2022-06-09 ENCOUNTER — Telehealth (INDEPENDENT_AMBULATORY_CARE_PROVIDER_SITE_OTHER): Payer: No Typology Code available for payment source | Admitting: Family Medicine

## 2022-06-14 ENCOUNTER — Telehealth (HOSPITAL_BASED_OUTPATIENT_CLINIC_OR_DEPARTMENT_OTHER): Payer: Self-pay | Admitting: Obstetrics & Gynecology

## 2022-06-14 NOTE — Telephone Encounter (Signed)
Patient called and would to see if she can get a change to her medication the one she on is not working.

## 2022-06-15 ENCOUNTER — Ambulatory Visit: Payer: No Typology Code available for payment source | Admitting: Plastic Surgery

## 2022-06-15 ENCOUNTER — Ambulatory Visit (HOSPITAL_BASED_OUTPATIENT_CLINIC_OR_DEPARTMENT_OTHER): Payer: No Typology Code available for payment source

## 2022-06-15 ENCOUNTER — Other Ambulatory Visit (HOSPITAL_BASED_OUTPATIENT_CLINIC_OR_DEPARTMENT_OTHER): Payer: Self-pay

## 2022-06-15 DIAGNOSIS — Z0000 Encounter for general adult medical examination without abnormal findings: Secondary | ICD-10-CM

## 2022-06-15 NOTE — Telephone Encounter (Signed)
Pt is interested in discussing adding medication or changing the birth control she is on. She states what she has now (OCP) is not really working for her. She has been bleeding for 3 weeks. Pt provided with appt to discuss options.

## 2022-06-16 ENCOUNTER — Other Ambulatory Visit (HOSPITAL_BASED_OUTPATIENT_CLINIC_OR_DEPARTMENT_OTHER): Payer: Self-pay | Admitting: Family Medicine

## 2022-06-16 ENCOUNTER — Encounter (HOSPITAL_BASED_OUTPATIENT_CLINIC_OR_DEPARTMENT_OTHER): Payer: Self-pay

## 2022-06-16 DIAGNOSIS — Z8269 Family history of other diseases of the musculoskeletal system and connective tissue: Secondary | ICD-10-CM

## 2022-06-16 LAB — VITAMIN D 25 HYDROXY (VIT D DEFICIENCY, FRACTURES): Vit D, 25-Hydroxy: 29.1 ng/mL — ABNORMAL LOW (ref 30.0–100.0)

## 2022-06-16 LAB — CBC WITH DIFFERENTIAL/PLATELET
Basophils Absolute: 0 10*3/uL (ref 0.0–0.2)
Basos: 0 %
EOS (ABSOLUTE): 0.1 10*3/uL (ref 0.0–0.4)
Eos: 1 %
Hematocrit: 32.2 % — ABNORMAL LOW (ref 34.0–46.6)
Hemoglobin: 10 g/dL — ABNORMAL LOW (ref 11.1–15.9)
Immature Grans (Abs): 0 10*3/uL (ref 0.0–0.1)
Immature Granulocytes: 0 %
Lymphocytes Absolute: 4.4 10*3/uL — ABNORMAL HIGH (ref 0.7–3.1)
Lymphs: 47 %
MCH: 27.6 pg (ref 26.6–33.0)
MCHC: 31.1 g/dL — ABNORMAL LOW (ref 31.5–35.7)
MCV: 89 fL (ref 79–97)
Monocytes Absolute: 0.5 10*3/uL (ref 0.1–0.9)
Monocytes: 6 %
Neutrophils Absolute: 4.4 10*3/uL (ref 1.4–7.0)
Neutrophils: 46 %
Platelets: 487 10*3/uL — ABNORMAL HIGH (ref 150–450)
RBC: 3.62 x10E6/uL — ABNORMAL LOW (ref 3.77–5.28)
RDW: 12.3 % (ref 11.7–15.4)
WBC: 9.5 10*3/uL (ref 3.4–10.8)

## 2022-06-16 LAB — HEMOGLOBIN A1C
Est. average glucose Bld gHb Est-mCnc: 88 mg/dL
Hgb A1c MFr Bld: 4.7 % — ABNORMAL LOW (ref 4.8–5.6)

## 2022-06-16 LAB — LIPID PANEL
Chol/HDL Ratio: 3.4 ratio (ref 0.0–4.4)
Cholesterol, Total: 177 mg/dL (ref 100–199)
HDL: 52 mg/dL (ref >39)
LDL Chol Calc (NIH): 99 mg/dL (ref 0–99)
Triglycerides: 146 mg/dL (ref 0–149)
VLDL Cholesterol Cal: 26 mg/dL (ref 5–40)

## 2022-06-16 LAB — TSH RFX ON ABNORMAL TO FREE T4: TSH: 3.23 u[IU]/mL (ref 0.450–4.500)

## 2022-06-18 ENCOUNTER — Ambulatory Visit (INDEPENDENT_AMBULATORY_CARE_PROVIDER_SITE_OTHER): Payer: No Typology Code available for payment source | Admitting: Family Medicine

## 2022-06-18 ENCOUNTER — Ambulatory Visit (HOSPITAL_BASED_OUTPATIENT_CLINIC_OR_DEPARTMENT_OTHER): Payer: No Typology Code available for payment source

## 2022-06-18 ENCOUNTER — Other Ambulatory Visit (HOSPITAL_COMMUNITY): Payer: Self-pay

## 2022-06-18 ENCOUNTER — Encounter (HOSPITAL_BASED_OUTPATIENT_CLINIC_OR_DEPARTMENT_OTHER): Payer: Self-pay | Admitting: Family Medicine

## 2022-06-18 ENCOUNTER — Other Ambulatory Visit (HOSPITAL_BASED_OUTPATIENT_CLINIC_OR_DEPARTMENT_OTHER): Payer: Self-pay

## 2022-06-18 VITALS — BP 135/94 | HR 92 | Ht 66.0 in | Wt 290.0 lb

## 2022-06-18 DIAGNOSIS — D649 Anemia, unspecified: Secondary | ICD-10-CM | POA: Diagnosis not present

## 2022-06-18 DIAGNOSIS — G4700 Insomnia, unspecified: Secondary | ICD-10-CM | POA: Diagnosis not present

## 2022-06-18 DIAGNOSIS — E559 Vitamin D deficiency, unspecified: Secondary | ICD-10-CM | POA: Diagnosis not present

## 2022-06-18 DIAGNOSIS — F411 Generalized anxiety disorder: Secondary | ICD-10-CM

## 2022-06-18 DIAGNOSIS — Z0000 Encounter for general adult medical examination without abnormal findings: Secondary | ICD-10-CM | POA: Diagnosis not present

## 2022-06-18 DIAGNOSIS — L732 Hidradenitis suppurativa: Secondary | ICD-10-CM

## 2022-06-18 MED ORDER — DOXYCYCLINE HYCLATE 100 MG PO TABS
100.0000 mg | ORAL_TABLET | Freq: Two times a day (BID) | ORAL | 0 refills | Status: DC
Start: 2022-06-18 — End: 2022-11-11
  Filled 2022-06-18: qty 60, 30d supply, fill #0

## 2022-06-18 MED ORDER — TRAZODONE HCL 100 MG PO TABS
100.0000 mg | ORAL_TABLET | Freq: Every evening | ORAL | 2 refills | Status: DC | PRN
Start: 2022-06-18 — End: 2022-11-11
  Filled 2022-06-18: qty 30, 30d supply, fill #0
  Filled 2022-07-12: qty 30, 30d supply, fill #1

## 2022-06-18 NOTE — Progress Notes (Signed)
Complete physical exam  Patient: Tammy [name]   DOB: 2000/09/22   22 y.o. Female  MRN: [id number]  Subjective:    Tammy [name] is a 22 y.o. female who presents today for a complete physical exam. She reports consuming a general diet. She generally feels well. She reports sleeping fairly well. She does not have additional problems to discuss today.   Anemia- history of iron infusions when she is on her menstrual cycle. Denies chest pain, palpitations, changes in vision, shortness of breath, lower extremity edema, headaches, lightheadedness, weakness, cough. LMP: currently on her cycle, has been for 2 weeks   She starts spotting if she does not take her pills- has a future appt with Dr. Hyacinth Meeker   Depression screenings:    03/19/2022   10:07 AM 01/28/2022   11:40 AM 12/24/2021   11:08 AM  Depression screen PHQ 2/9  Decreased Interest 0 0 0  Down, Depressed, Hopeless 0 0 0  PHQ - 2 Score 0 0 0  Altered sleeping   1  Tired, decreased energy   1  Change in appetite   0  Feeling bad or failure about yourself    0  Trouble concentrating   0  Moving slowly or fidgety/restless   0  Suicidal thoughts   0  PHQ-9 Score   2  Difficult doing work/chores   Somewhat difficult    Patient Care Team: Alyson Reedy, FNP as PCP - General (Family Medicine)   Outpatient Medications Prior to Visit  Medication Sig   famotidine (PEPCID) 40 MG tablet Take 1 tablet (40 mg total) by mouth at bedtime.   norethindrone-ethinyl estradiol-FE (LOESTRIN FE) 1-20 MG-MCG tablet Take 1 tablet by mouth daily.   tirzepatide (ZEPBOUND) 7.5 MG/0.5ML Pen Inject 7.5 mg into the skin once a week.   [DISCONTINUED] sulfamethoxazole-trimethoprim (BACTRIM DS) 800-160 MG tablet Take 1 tablet by mouth 2 (two) times daily.   [DISCONTINUED] traZODone (DESYREL) 50 MG tablet Take 0.5-1 tablets (25-50 mg total) by mouth at bedtime as needed for sleep.   No facility-administered medications prior to visit.   Review of Systems   Constitutional:  Negative for malaise/fatigue.  Eyes:  Negative for blurred vision and double vision.  Respiratory:  Negative for cough and shortness of breath.   Cardiovascular:  Negative for chest pain and palpitations.  Gastrointestinal:  Negative for abdominal pain, nausea and vomiting.  Musculoskeletal:  Negative for myalgias.  Neurological:  Positive for headaches (occ). Negative for dizziness and weakness.  Psychiatric/Behavioral:  Negative for depression, substance abuse and suicidal ideas. The patient has insomnia. The patient is not nervous/anxious.      Objective:    BP (!) 135/94   Pulse 92   Ht 5\' 6"  (1.676 m)   Wt 290 lb (131.5 kg)   LMP 05/07/2022 (Approximate)   SpO2 100%   BMI 46.81 kg/m  BP Readings from Last 3 Encounters:  06/18/22 (!) 135/94  05/21/22 (!) 119/58  05/12/22 135/85   Physical Exam Constitutional:      Appearance: Normal appearance.  HENT:     Right Ear: Tympanic membrane, ear canal and external ear normal.     Left Ear: Tympanic membrane, ear canal and external ear normal.     Nose: Nose normal.     Mouth/Throat:     Mouth: Mucous membranes are moist.     Pharynx: Oropharynx is clear.  Eyes:     Extraocular Movements: Extraocular movements intact.     Pupils: Pupils are  equal, round, and reactive to light.  Cardiovascular:     Rate and Rhythm: Normal rate and regular rhythm.     Pulses: Normal pulses.     Heart sounds: Normal heart sounds.  Pulmonary:     Effort: Pulmonary effort is normal.     Breath sounds: Normal breath sounds.  Abdominal:     General: Bowel sounds are normal.     Palpations: Abdomen is soft.  Musculoskeletal:        General: Normal range of motion.  Skin:    General: Skin is warm and dry.  Neurological:     Mental Status: She is alert.  Psychiatric:        Mood and Affect: Mood normal.        Behavior: Behavior normal.        Thought Content: Thought content normal.        Judgment: Judgment normal.        Assessment & Plan:    Routine Health Maintenance and Physical Exam  Health Maintenance  Topic Date Due   COVID-19 Vaccine (1) Never done   HPV Vaccine (1 - 2-dose series) Never done   DTaP/Tdap/Td vaccine (1 - Tdap) Never done   Hepatitis C Screening  01/05/2023*   Flu Shot  08/05/2022   Pap Smear  03/18/2025   Pap Smear  03/18/2025   HIV Screening  Completed  *Topic was postponed. The date shown is not the original due date.    1. Wellness examination Routine HCM labs ordered. Labs reviewed/discussed today.  CMP was never processed. Plan to order again today and assess glucose, electrolytes, kidney function and liver function. Review of PMH, FH, SH, medications and HM performed.  Recommend healthy diet.  Recommend approximately 150 minutes/week of moderate intensity exercise. Recommend regular dental and vision exams. Always use seatbelt/lap and shoulder restraints. Recommend using smoke alarms and checking batteries at least twice a year. Recommend using sunscreen when outside. Discussed immunization recommendations. Vaccines are up to date.  Last pap smear:  03/19/2022, normal cytology   - Comprehensive metabolic panel  2. Vitamin D deficiency Review of recent labs indicates mild vitamin D deficiency. Discussed with patient. Advised her to take OTC 600-800IUs daily.   3. Anemia, unspecified type Review of labs, with decreased hemoglobin and hematocrit. Patient reports she does not notice symptoms of anemia. Attempted to add on iron studies to previous labs; however, unable to run. Will order iron studies today to see if this is due to iron deficiency. Will treat accordingly.  - Iron, TIBC and Ferritin Panel  4. Insomnia, unspecified ty/pe Patient reports having issues staying asleep. She often wakes up in the middle of the night and is awake for about 3-4 hours and has to wake up early for work. Discussed options, plan to increase trazodone to 10mg  at bedtime.  -  traZODone (DESYREL) 100 MG tablet; Take 1 tablet (100 mg total) by mouth at bedtime as needed for sleep.  Dispense: 30 tablet; Refill: 2  5. Hidradenitis suppurativa Patient reports dealing with excessive itching under her armpits and in her groin region. She has experienced a few lumps that have grown in both areas. Physical exam unremarkable today for lumps, skin tunnel, abscess. Patient prefers to not see dermatology at this time. Likely etiology is hidradenitis suppurativa- will initiate treatment with oral doxycycline for 4 weeks and reassess. If she is tolerating antibiotic well, will plan to continue for 2 more months- for a total of 3 months  of treatment. Discussed use of other drugs to help treat symptoms.   - doxycycline (VIBRA-TABS) 100 MG tablet; Take 1 tablet (100 mg total) by mouth 2 (two) times daily.  Dispense: 60 tablet; Refill: 0  Spent an additional 20 minutes discussing extra topics out of preventative care on this patient encounter, including preparation, chart review, face-to-face counseling with patient and coordination of care, and documentation of encounter.    Return in about 4 weeks (around 07/16/2022) for skin irritation follow-up .     Alyson Reedy, FNP

## 2022-06-19 LAB — COMPREHENSIVE METABOLIC PANEL
ALT: 8 IU/L (ref 0–32)
AST: 15 IU/L (ref 0–40)
Albumin/Globulin Ratio: 1.7
Albumin: 4.3 g/dL (ref 4.0–5.0)
Alkaline Phosphatase: 59 IU/L (ref 44–121)
BUN/Creatinine Ratio: 7 — ABNORMAL LOW (ref 9–23)
BUN: 7 mg/dL (ref 6–20)
Bilirubin Total: 0.4 mg/dL (ref 0.0–1.2)
CO2: 20 mmol/L (ref 20–29)
Calcium: 9.4 mg/dL (ref 8.7–10.2)
Chloride: 103 mmol/L (ref 96–106)
Creatinine, Ser: 0.97 mg/dL (ref 0.57–1.00)
Globulin, Total: 2.6 g/dL (ref 1.5–4.5)
Glucose: 74 mg/dL (ref 70–99)
Potassium: 4.1 mmol/L (ref 3.5–5.2)
Sodium: 139 mmol/L (ref 134–144)
Total Protein: 6.9 g/dL (ref 6.0–8.5)
eGFR: 85 mL/min/{1.73_m2} (ref >59)

## 2022-06-19 LAB — IRON,TIBC AND FERRITIN PANEL
Ferritin: 204 ng/mL — ABNORMAL HIGH (ref 15–150)
Iron Saturation: 10 % — ABNORMAL LOW (ref 15–55)
Iron: 36 ug/dL (ref 27–159)
Total Iron Binding Capacity: 361 ug/dL (ref 250–450)
UIBC: 325 ug/dL (ref 131–425)

## 2022-06-23 ENCOUNTER — Encounter (INDEPENDENT_AMBULATORY_CARE_PROVIDER_SITE_OTHER): Payer: Self-pay | Admitting: Family Medicine

## 2022-06-23 ENCOUNTER — Ambulatory Visit (INDEPENDENT_AMBULATORY_CARE_PROVIDER_SITE_OTHER): Payer: No Typology Code available for payment source | Admitting: Family Medicine

## 2022-06-23 ENCOUNTER — Other Ambulatory Visit (HOSPITAL_BASED_OUTPATIENT_CLINIC_OR_DEPARTMENT_OTHER): Payer: Self-pay

## 2022-06-23 VITALS — BP 121/85 | HR 70 | Temp 97.5°F | Ht 66.0 in | Wt 288.0 lb

## 2022-06-23 DIAGNOSIS — R1013 Epigastric pain: Secondary | ICD-10-CM | POA: Diagnosis not present

## 2022-06-23 DIAGNOSIS — L732 Hidradenitis suppurativa: Secondary | ICD-10-CM

## 2022-06-23 DIAGNOSIS — E559 Vitamin D deficiency, unspecified: Secondary | ICD-10-CM

## 2022-06-23 DIAGNOSIS — D500 Iron deficiency anemia secondary to blood loss (chronic): Secondary | ICD-10-CM

## 2022-06-23 DIAGNOSIS — Z6841 Body Mass Index (BMI) 40.0 and over, adult: Secondary | ICD-10-CM

## 2022-06-23 MED ORDER — ZEPBOUND 7.5 MG/0.5ML ~~LOC~~ SOAJ
7.5000 mg | SUBCUTANEOUS | 1 refills | Status: DC
Start: 2022-06-23 — End: 2022-08-05
  Filled 2022-06-23: qty 2, 28d supply, fill #0
  Filled 2022-07-28: qty 2, 28d supply, fill #1

## 2022-06-23 MED ORDER — FERROUS GLUCONATE 324 (38 FE) MG PO TABS
324.0000 mg | ORAL_TABLET | Freq: Every day | ORAL | 0 refills | Status: AC
Start: 2022-06-23 — End: ?
  Filled 2022-06-23: qty 100, 100d supply, fill #0

## 2022-06-23 NOTE — Assessment & Plan Note (Signed)
Started doxycycline by primary care for HS Complains of abdominal pain this morning after not eating with morning dose. Denies vomiting or diarrhea. Has used Hibiclens in the past  Recommend continuing doxycycline as prescribed by primary care but taking it with food.

## 2022-06-23 NOTE — Assessment & Plan Note (Signed)
Last vitamin D Lab Results  Component Value Date   VD25OH 29.1 (L) 06/15/2022   Reviewed labs drawn by PCP.  Her vitamin D level remains low at 29.1.  For improvements in leptin resistance and energy levels, recommend vitamin D level 50-70.  Recommend increasing vitamin D over-the-counter to 4000 IU once daily.  We will recheck her labs here in 3 to 4 months.

## 2022-06-23 NOTE — Assessment & Plan Note (Signed)
Reviewed recent labs drawn by PCP showing iron deficiency anemia.  She has had prolonged menstrual bleeding occurring for up to 3 weeks at a time.  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  She is currently off birth control.  She is scheduled to see her OB/GYN.  She does report fatigue.  She is currently not on an iron supplement.  She has not yet started over-the-counter iron but is complaining of abdominal pain from her doxycycline for hidradenitis suppurativa.  Begin ferrous gluconate 324 mg prescription once daily.  Keep upcoming visit with OB/GYN to discuss dysfunctional uterine bleeding which is the cause of her iron deficiency anemia likely.

## 2022-06-23 NOTE — Progress Notes (Signed)
Office: [phone]  /  Fax: [phone]  WEIGHT SUMMARY AND BIOMETRICS  Starting Date: 08/13/21  Starting Weight: 298lb   Weight Lost Since Last Visit: 5lb   Vitals Temp: (!) 97.5 F (36.4 C) BP: 121/85 Pulse Rate: 70 SpO2: 97 %   Body Composition  Body Fat %: 52.9 % Fat Mass (lbs): 152.8 lbs Muscle Mass (lbs): 129.2 lbs Visceral Fat Rating : 15     HPI  Chief Complaint: OBESITY  Tammy [name] is here to discuss her progress with her obesity treatment plan. She is on the category 3 plan and states she is following her eating plan approximately 40 % of the time. She states she has been moving over the last week.    Interval History:  Since last office visit she is down 5 lb She has a net weight loss of 10 pounds in the past 10 months of medically supervised weight management She hasn't been getting in all of her meals with moving out of her house. Staying with uncle and moving to a new house with her mom mid July She is getting bored with her new job, sitting a lot She isn't sleeping well  She did move up her Zepbound to 7.5 mg weekly.  She did have a 3 week gap without it due to pharmacy shortages She has been eating out more due to moving and her aunt and uncle do not cook healthy foods She is skipping meals early in the day.  She was going to the gym 2 x a week but she is having joint pains and is going to be seeing rheumatology She has some IDA due to prolonged menstrual bleeding -- off birth control She had needed iron infusion in [place] She has previously tried OCPs, Depo Provera and an IUD and these contributed to 50 lb  She is currently having issues with nausea from doxycycline for HS--taking it without food   Pharmacotherapy: Zepbound 7.5 mg week  PHYSICAL EXAM:  Blood pressure 121/85, pulse 70, temperature (!) 97.5 F (36.4 C), height 5\' 6"  (1.676 m), weight 288 lb (130.6 kg), SpO2 97 %. Body mass index is 46.48 kg/m.  General: She is overweight,  cooperative, alert, well developed, and in no acute distress. PSYCH: Has normal mood, affect and thought process.   Lungs: Normal breathing effort, no conversational dyspnea.   ASSESSMENT AND PLAN  TREATMENT PLAN FOR OBESITY:  Recommended Dietary Goals  Tammy [name] is currently in the action stage of change. As such, her goal is to continue weight management plan. She has agreed to the Category 3 Plan.  Behavioral Intervention  We discussed the following Behavioral Modification Strategies today: increasing lean protein intake, decreasing simple carbohydrates , increasing vegetables, increasing lower glycemic fruits, increasing water intake, continue to practice mindfulness when eating, and planning for success.  Additional resources provided today: NA  Recommended Physical Activity Goals  Tammy [name] has been advised to work up to 150 minutes of moderate intensity aerobic activity a week and strengthening exercises 2-3 times per week for cardiovascular health, weight loss maintenance and preservation of muscle mass.   She has agreed to Think about ways to increase daily physical activity and overcoming barriers to exercise  Pharmacotherapy changes for the treatment of obesity: None  ASSOCIATED CONDITIONS ADDRESSED TODAY  Iron deficiency anemia due to chronic blood loss Assessment & Plan: Reviewed recent labs drawn by PCP showing iron deficiency anemia.  She has had prolonged menstrual bleeding occurring for up to 3 weeks at a  time.  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  She is currently off birth control.  She is scheduled to see her OB/GYN.  She does report fatigue.  She is currently not on an iron supplement.  She has not yet started over-the-counter iron but is complaining of abdominal pain from her doxycycline for hidradenitis suppurativa.  Begin ferrous gluconate 324 mg prescription once daily.  Keep upcoming visit with OB/GYN to  discuss dysfunctional uterine bleeding which is the cause of her iron deficiency anemia likely.  Orders: -     Ferrous Gluconate; Take 1 tablet (324 mg total) by mouth daily.  Dispense: 100 tablet; Refill: 0  Dyspepsia Assessment & Plan: Improving with famotidine 40 mg daily.  This has helped with dyspepsia due to use of tirzepatide 7.5 mg once weekly injection.  She is avoiding high acid foods and late-night eating.  Continue current plan of care   Morbid obesity (HCC) with starting BMI 48 -     Zepbound; Inject 7.5 mg into the skin once a week.  Dispense: 2 mL; Refill: 1  BMI 45.0-49.9, adult (HCC)  Vitamin D deficiency Assessment & Plan: Last vitamin D Lab Results  Component Value Date   VD25OH 29.1 (L) 06/15/2022   Reviewed labs drawn by PCP.  Her vitamin D level remains low at 29.1.  For improvements in leptin resistance and energy levels, recommend vitamin D level 50-70.  Recommend increasing vitamin D over-the-counter to 4000 IU once daily.  We will recheck her labs here in 3 to 4 months.   Hidradenitis suppurativa Assessment & Plan: Started doxycycline by primary care for HS Complains of abdominal pain this morning after not eating with morning dose. Denies vomiting or diarrhea. Has used Hibiclens in the past  Recommend continuing doxycycline as prescribed by primary care but taking it with food.       She was informed of the importance of frequent follow up visits to maximize her success with intensive lifestyle modifications for her multiple health conditions.   ATTESTASTION STATEMENTS:  Reviewed by clinician on day of visit: allergies, medications, problem list, medical history, surgical history, family history, social history, and previous encounter notes pertinent to obesity diagnosis.   I have personally spent 30 minutes total time today in preparation, patient care, nutritional counseling and documentation for this visit, including the following: review  of clinical lab tests; review of medical tests/procedures/services.      Glennis Brink, DO DABFM, DABOM [facility] [address] [place], [place] [address] [phone]

## 2022-06-23 NOTE — Assessment & Plan Note (Signed)
Improving with famotidine 40 mg daily.  This has helped with dyspepsia due to use of tirzepatide 7.5 mg once weekly injection.  She is avoiding high acid foods and late-night eating.  Continue current plan of care

## 2022-06-25 ENCOUNTER — Other Ambulatory Visit (HOSPITAL_BASED_OUTPATIENT_CLINIC_OR_DEPARTMENT_OTHER): Payer: Self-pay

## 2022-06-25 ENCOUNTER — Encounter (HOSPITAL_BASED_OUTPATIENT_CLINIC_OR_DEPARTMENT_OTHER): Payer: No Typology Code available for payment source | Admitting: Family Medicine

## 2022-07-01 ENCOUNTER — Ambulatory Visit (INDEPENDENT_AMBULATORY_CARE_PROVIDER_SITE_OTHER): Payer: No Typology Code available for payment source | Admitting: Family Medicine

## 2022-07-01 ENCOUNTER — Encounter (HOSPITAL_BASED_OUTPATIENT_CLINIC_OR_DEPARTMENT_OTHER): Payer: Self-pay | Admitting: Obstetrics & Gynecology

## 2022-07-01 ENCOUNTER — Other Ambulatory Visit (HOSPITAL_BASED_OUTPATIENT_CLINIC_OR_DEPARTMENT_OTHER): Payer: Self-pay | Admitting: Obstetrics & Gynecology

## 2022-07-01 ENCOUNTER — Ambulatory Visit (HOSPITAL_BASED_OUTPATIENT_CLINIC_OR_DEPARTMENT_OTHER): Payer: No Typology Code available for payment source | Admitting: Obstetrics & Gynecology

## 2022-07-01 VITALS — BP 118/71 | HR 82 | Ht 66.0 in | Wt 292.8 lb

## 2022-07-01 DIAGNOSIS — N926 Irregular menstruation, unspecified: Secondary | ICD-10-CM

## 2022-07-01 DIAGNOSIS — D649 Anemia, unspecified: Secondary | ICD-10-CM

## 2022-07-01 MED ORDER — NORELGESTROMIN-ETH ESTRADIOL 150-35 MCG/24HR TD PTWK
1.0000 | MEDICATED_PATCH | TRANSDERMAL | 3 refills | Status: DC
Start: 2022-07-01 — End: 2022-11-11
  Filled 2022-07-01: qty 3, 21d supply, fill #0
  Filled 2022-07-12: qty 3, 21d supply, fill #1

## 2022-07-01 NOTE — Progress Notes (Signed)
GYNECOLOGY  VISIT  CC:   irregular bleeding  HPI: 22 y.o. G0P0000 Single Black or [place] American female here for discussion of OCP.  She is on loestrin 1/20 FE.  If she misses one day she will start bleeding.  Once she starts bleeding, it will take two or three days until she's stops.  Cycles are not heavy but she is having frustration with taking pills very, very regularly.  Does need contraception so other options reviewed.  Has use an IUD and had a lot of cramping with it.  Does not want to try this again.  Nuva ring and patch discussed.  These are combination methods so risks reviewed.  Blood pressure is normal today and has been with current OCP so feel ok with using either method.  She likes idea of patch.  H/o anemia that is normocytic.  Testing for b12 and folate is normal.  Testing for thalassemia recommended.  If normal, would refer to hematology.   Past Medical History:  Diagnosis Date   Anemia    Anxiety disorder 03/25/2021   History of anemia    h/o iron infusions x 2   Localized scleroderma (morphea)    Lymphedema    Migraines     MEDS:   Current Outpatient Medications on File Prior to Visit  Medication Sig Dispense Refill   famotidine (PEPCID) 40 MG tablet Take 1 tablet (40 mg total) by mouth at bedtime. 30 tablet 0   norethindrone-ethinyl estradiol-FE (LOESTRIN FE) 1-20 MG-MCG tablet Take 1 tablet by mouth daily. 84 tablet 0   tirzepatide (ZEPBOUND) 7.5 MG/0.5ML Pen Inject 7.5 mg into the skin once a week. 2 mL 1   traZODone (DESYREL) 100 MG tablet Take 1 tablet (100 mg total) by mouth at bedtime as needed for sleep. 30 tablet 2   doxycycline (VIBRA-TABS) 100 MG tablet Take 1 tablet (100 mg total) by mouth 2 (two) times daily. (Patient not taking: Reported on 07/01/2022) 60 tablet 0   ferrous gluconate (FERGON) 324 MG tablet Take 1 tablet (324 mg total) by mouth daily. (Patient not taking: Reported on 07/01/2022) 100 tablet 0   No current facility-administered  medications on file prior to visit.    ALLERGIES: Patient has no known allergies.  SH:  single, non smoker  Review of Systems  Constitutional: Negative.   Genitourinary:        Irregular bleeding at times    PHYSICAL EXAMINATION:    BP 118/71 (BP Location: Right Arm, Patient Position: Sitting, Cuff Size: Large)   Pulse 82   Ht 5\' 6"  (1.676 m)   Wt 292 lb 12.8 oz (132.8 kg)   LMP 06/29/2022   BMI 47.26 kg/m     Physical Exam Constitutional:      Appearance: Normal appearance.  Neurological:     General: No focal deficit present.     Mental Status: She is alert.  Psychiatric:        Behavior: Behavior normal.    Assessment/Plan: 1. Irregular bleeding - will start patch when OCP pack she is currently taking is completed.  Instructions for use given - norelgestromin-ethinyl estradiol [facility]) 150-35 MCG/24HR transdermal patch; Place 1 patch onto the skin once a week.  Dispense: 3 patch; Refill: 3  2. Anemia, unspecified type - Hgb Fractionation Cascade; Future

## 2022-07-02 ENCOUNTER — Ambulatory Visit: Payer: No Typology Code available for payment source | Admitting: Plastic Surgery

## 2022-07-02 ENCOUNTER — Other Ambulatory Visit: Payer: Self-pay | Admitting: Obstetrics & Gynecology

## 2022-07-02 ENCOUNTER — Other Ambulatory Visit (HOSPITAL_BASED_OUTPATIENT_CLINIC_OR_DEPARTMENT_OTHER): Payer: Self-pay

## 2022-07-02 ENCOUNTER — Encounter: Payer: Self-pay | Admitting: Plastic Surgery

## 2022-07-02 ENCOUNTER — Encounter (HOSPITAL_BASED_OUTPATIENT_CLINIC_OR_DEPARTMENT_OTHER): Payer: No Typology Code available for payment source | Admitting: Family Medicine

## 2022-07-02 VITALS — BP 114/77 | HR 80 | Wt 291.2 lb

## 2022-07-02 DIAGNOSIS — N62 Hypertrophy of breast: Secondary | ICD-10-CM | POA: Diagnosis not present

## 2022-07-02 DIAGNOSIS — M542 Cervicalgia: Secondary | ICD-10-CM

## 2022-07-02 DIAGNOSIS — R21 Rash and other nonspecific skin eruption: Secondary | ICD-10-CM | POA: Diagnosis not present

## 2022-07-02 DIAGNOSIS — M549 Dorsalgia, unspecified: Secondary | ICD-10-CM | POA: Diagnosis not present

## 2022-07-02 DIAGNOSIS — D649 Anemia, unspecified: Secondary | ICD-10-CM

## 2022-07-02 NOTE — Progress Notes (Addendum)
   Subjective:    Patient ID: Tammy [name], female    DOB: September 06, 2000, 22 y.o.   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  She is 5 feet 6 inches tall and weighs 291 pounds which is down from 305.  This is great direction she is headed in.  She is got a lot going on with moving her house and with work.  She still has the neck and the back pain.  She has been on some medications to help with the weight loss.  There is a backorder so some of it was slowed down and she is trying hard to continue with the plan.      Review of Systems  Constitutional: Negative.   HENT: Negative.    Eyes: Negative.   Respiratory: Negative.    Cardiovascular: Negative.   Gastrointestinal: Negative.   Endocrine: Negative.   Genitourinary: Negative.   Musculoskeletal:  Positive for back pain and neck pain.  Skin:  Positive for rash.       Objective:   Physical Exam Vitals reviewed.  Constitutional:      Appearance: Normal appearance.  Cardiovascular:     Rate and Rhythm: Normal rate.     Pulses: Normal pulses.  Pulmonary:     Effort: Pulmonary effort is normal.  Musculoskeletal:        General: No swelling or deformity.  Skin:    General: Skin is warm.     Capillary Refill: Capillary refill takes less than 2 seconds.     Coloration: Skin is not jaundiced.  Neurological:     Mental Status: She is alert and oriented to person, place, and time.  Psychiatric:        Mood and Affect: Mood normal.        Behavior: Behavior normal.        Thought Content: Thought content normal.        Judgment: Judgment normal.         Assessment & Plan:     ICD-10-CM   1. 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  We also talked about making sure she has regular meals and decreasing her carbohydrates in her sugars.

## 2022-07-07 LAB — HGB FRACTIONATION CASCADE
Hgb A2: 2.8 % (ref 1.8–3.2)
Hgb A: 97.2 % (ref 96.4–98.8)
Hgb F: 0 % (ref 0.0–2.0)
Hgb S: 0 %

## 2022-07-07 LAB — VITAMIN B12: Vitamin B-12: 448 pg/mL (ref 232–1245)

## 2022-07-08 LAB — FERRITIN: Ferritin: 201 ng/mL — ABNORMAL HIGH (ref 15–150)

## 2022-07-08 LAB — SPECIMEN STATUS REPORT

## 2022-07-12 ENCOUNTER — Other Ambulatory Visit: Payer: Self-pay

## 2022-07-12 ENCOUNTER — Other Ambulatory Visit (HOSPITAL_BASED_OUTPATIENT_CLINIC_OR_DEPARTMENT_OTHER): Payer: Self-pay

## 2022-07-12 ENCOUNTER — Other Ambulatory Visit (INDEPENDENT_AMBULATORY_CARE_PROVIDER_SITE_OTHER): Payer: Self-pay | Admitting: Family Medicine

## 2022-07-12 DIAGNOSIS — R1013 Epigastric pain: Secondary | ICD-10-CM

## 2022-07-13 NOTE — Addendum Note (Signed)
Addended by: Jerene Bears on: 07/13/2022 07:12 AM   Modules accepted: Orders

## 2022-07-14 ENCOUNTER — Other Ambulatory Visit (HOSPITAL_BASED_OUTPATIENT_CLINIC_OR_DEPARTMENT_OTHER): Payer: Self-pay

## 2022-07-16 ENCOUNTER — Other Ambulatory Visit (HOSPITAL_BASED_OUTPATIENT_CLINIC_OR_DEPARTMENT_OTHER): Payer: Self-pay

## 2022-07-16 ENCOUNTER — Telehealth (HOSPITAL_BASED_OUTPATIENT_CLINIC_OR_DEPARTMENT_OTHER): Payer: Self-pay | Admitting: *Deleted

## 2022-07-16 ENCOUNTER — Encounter (HOSPITAL_BASED_OUTPATIENT_CLINIC_OR_DEPARTMENT_OTHER): Payer: Self-pay | Admitting: Family Medicine

## 2022-07-16 ENCOUNTER — Ambulatory Visit (INDEPENDENT_AMBULATORY_CARE_PROVIDER_SITE_OTHER): Payer: No Typology Code available for payment source | Admitting: Family Medicine

## 2022-07-16 VITALS — BP 143/94 | HR 92 | Ht 66.0 in | Wt 287.4 lb

## 2022-07-16 DIAGNOSIS — N939 Abnormal uterine and vaginal bleeding, unspecified: Secondary | ICD-10-CM | POA: Diagnosis not present

## 2022-07-16 DIAGNOSIS — R600 Localized edema: Secondary | ICD-10-CM

## 2022-07-16 DIAGNOSIS — L732 Hidradenitis suppurativa: Secondary | ICD-10-CM

## 2022-07-16 MED ORDER — HYDROCHLOROTHIAZIDE 12.5 MG PO TABS
12.5000 mg | ORAL_TABLET | Freq: Every day | ORAL | 3 refills | Status: DC
Start: 2022-07-16 — End: 2022-11-11
  Filled 2022-07-16: qty 90, 90d supply, fill #0

## 2022-07-16 NOTE — Telephone Encounter (Signed)
-----   Message from Jerene Bears sent at 07/15/2022  9:00 AM EDT ----- Regarding: RE: I do not want to give her more medication.  Just the patch once a week x weeks and then off for one week.  Hopefully this will reset the bleeding.  Thanks.   MSM ----- Message ----- From: Harrie Jeans, RN Sent: 07/14/2022   4:46 PM EDT To: Jerene Bears, MD  Pt reports that she started bleeding 4 days after putting on the new patch (she saw you on 6/27) and has been bleeding since. She is changing pad about 2-3 times a day. Is there a medication that you would like to prescribe in addition to the patch or should she just give it more time? Thanks!  Selena Batten

## 2022-07-16 NOTE — Progress Notes (Unsigned)
Acute Office Visit  Subjective:     Patient ID: Tammy [name], female    DOB: 06/27/2000, 22 y.o.   MRN: [id number]  Chief Complaint  Patient presents with   Follow-up    Pt here for f/u on skin irritation    Tammy [name] is a 22 year-old female patient who reports today regarding follow-up for HS and ongoing menstrual bleeding.   HS- took doxycycline for 2 days and then suffered from extreme stomach pain and she reports feeling very nauseous. She reports the pain has improved and now she is just nauseous.   AUB- patient has experienced irregular menstrual cycles and trialed various methods of birth control since January. She did have an IUD in the past but experienced lots of cramping with it. She has been on Depo and reports that her menstrual bleeding was controlled but experienced significant weight gain. She then tried OCPs and is now on the patch. She reports frequent bleeding with birth control patch (has been on it since 6/27).   Patient has had abdominal pain about once a week, along with low back pain. Reports constipation- which for her this means having a BM every other day and not every day. She has not been taking her iron supplements.   She does have a history of ovarian cysts.    Review of Systems  Constitutional:  Negative for malaise/fatigue.  Respiratory:  Negative for cough.   Cardiovascular:  Positive for leg swelling. Negative for chest pain and palpitations.  Gastrointestinal:  Positive for abdominal pain, constipation and nausea. Negative for vomiting.  Musculoskeletal:  Negative for myalgias.  Neurological:  Negative for dizziness, weakness and headaches.  Psychiatric/Behavioral:  Negative for depression and suicidal ideas. The patient is not nervous/anxious.      Objective:    BP (!) 143/94 (BP Location: Left Arm, Patient Position: Sitting, Cuff Size: Large)   Pulse 92   Ht 5\' 6"  (1.676 m)   Wt 287 lb 6.4 oz (130.4 kg)   LMP 06/29/2022   SpO2 100%    BMI 46.39 kg/m   Physical Exam Constitutional:      Appearance: Normal appearance.  Cardiovascular:     Rate and Rhythm: Normal rate and regular rhythm.     Pulses: Normal pulses.     Heart sounds: Normal heart sounds.  Pulmonary:     Effort: Pulmonary effort is normal.     Breath sounds: Normal breath sounds.  Abdominal:     General: Abdomen is protuberant. Bowel sounds are normal.     Palpations: Abdomen is soft.     Tenderness: There is abdominal tenderness in the right lower quadrant and left lower quadrant. There is no guarding or rebound. Negative signs include Murphy's sign, Rovsing's sign and McBurney's sign.  Musculoskeletal:     Right lower leg: Edema present.     Left lower leg: Edema present.  Neurological:     Mental Status: She is alert.  Psychiatric:        Mood and Affect: Mood normal.        Behavior: Behavior normal.      Assessment & Plan:  1. Abnormal uterine bleeding (AUB) Patient has a history of metrorrhagia, despite use of multiple birth control methods. Patient recently saw Dr. Hyacinth Meeker on 6/27 and was changed from OCPs to transdermal birth control patch. Patient reports she is still having issues bleeding between her menstrual cycle. Recent lab work to identify if patient has thalassemia. Review of labs from  6/28 indicate no thalassemia trait detected. Recent CBC indicates anemia with hemoglobin of 10.0 and hematocrit 32.2. Plan to obtain pelvic ultrasound today to determine if patient is experiencing PALM-COIEN.   - US Pelvic Complete With Transvaginal  2. Hidradenitis suppurativa Patient was seen on 06/18/2022 and diagnosed with HS. PO treatment initiated with doxycycline. Patient reports taking medication for 2 days and experienced abdominal pain and nausea. She reports she did not continue with medication as prescribed. Discussed use of topical creams. Patient declines treatment at this time. Advised her to return to office if this skin irritation persists  or worsens.   3. Bilateral lower extremity edema Patient has history of lymphedema praecox and has moderate non-pitting edema +2 bilaterally in her ankles.  - hydrochlorothiazide (HYDRODIURIL) 12.5 MG tablet; Take 1 tablet (12.5 mg total) by mouth daily.  Dispense: 90 tablet; Refill: 3   Return in about 4 weeks (around 08/13/2022) for HTN follow-up.  Alyson Reedy, FNP

## 2022-07-16 NOTE — Telephone Encounter (Signed)
Advised pt that Dr. Hyacinth Meeker does not want to add any additional medications at this time. Pt reports that she saw her PCP today who is going to order a pelvic ultrasound.

## 2022-07-29 ENCOUNTER — Ambulatory Visit
Admission: RE | Admit: 2022-07-29 | Discharge: 2022-07-29 | Disposition: A | Discharge status: Home / Self Care | Payer: No Typology Code available for payment source | Source: Ambulatory Visit | Attending: Family Medicine | Admitting: Family Medicine

## 2022-07-29 ENCOUNTER — Ambulatory Visit (INDEPENDENT_AMBULATORY_CARE_PROVIDER_SITE_OTHER): Payer: No Typology Code available for payment source | Admitting: Family Medicine

## 2022-08-02 ENCOUNTER — Other Ambulatory Visit: Payer: No Typology Code available for payment source

## 2022-08-02 ENCOUNTER — Encounter (HOSPITAL_BASED_OUTPATIENT_CLINIC_OR_DEPARTMENT_OTHER): Payer: Self-pay | Admitting: Family Medicine

## 2022-08-04 ENCOUNTER — Other Ambulatory Visit (HOSPITAL_BASED_OUTPATIENT_CLINIC_OR_DEPARTMENT_OTHER): Payer: Self-pay

## 2022-08-05 ENCOUNTER — Other Ambulatory Visit (HOSPITAL_BASED_OUTPATIENT_CLINIC_OR_DEPARTMENT_OTHER): Payer: Self-pay

## 2022-08-05 ENCOUNTER — Encounter (INDEPENDENT_AMBULATORY_CARE_PROVIDER_SITE_OTHER): Payer: Self-pay | Admitting: Family Medicine

## 2022-08-05 ENCOUNTER — Ambulatory Visit (INDEPENDENT_AMBULATORY_CARE_PROVIDER_SITE_OTHER): Payer: No Typology Code available for payment source | Admitting: Family Medicine

## 2022-08-05 VITALS — BP 97/66 | HR 78 | Temp 97.8°F | Ht 66.0 in | Wt 279.0 lb

## 2022-08-05 DIAGNOSIS — F439 Reaction to severe stress, unspecified: Secondary | ICD-10-CM | POA: Diagnosis not present

## 2022-08-05 DIAGNOSIS — Z566 Other physical and mental strain related to work: Secondary | ICD-10-CM | POA: Insufficient documentation

## 2022-08-05 DIAGNOSIS — R1013 Epigastric pain: Secondary | ICD-10-CM | POA: Diagnosis not present

## 2022-08-05 DIAGNOSIS — E559 Vitamin D deficiency, unspecified: Secondary | ICD-10-CM

## 2022-08-05 DIAGNOSIS — R632 Polyphagia: Secondary | ICD-10-CM

## 2022-08-05 DIAGNOSIS — Z6841 Body Mass Index (BMI) 40.0 and over, adult: Secondary | ICD-10-CM

## 2022-08-05 MED ORDER — ZEPBOUND 10 MG/0.5ML ~~LOC~~ SOAJ
10.0000 mg | SUBCUTANEOUS | 0 refills | Status: AC
Start: 2022-08-05 — End: ?
  Filled 2022-08-05 – 2022-09-16 (×3): qty 2, 28d supply, fill #0

## 2022-08-05 MED ORDER — VITAMIN D (ERGOCALCIFEROL) 1.25 MG (50000 UNIT) PO CAPS
50000.0000 [IU] | ORAL_CAPSULE | ORAL | 0 refills | Status: AC
Start: 2022-08-05 — End: ?
  Filled 2022-08-05: qty 5, 35d supply, fill #0

## 2022-08-05 MED ORDER — FAMOTIDINE 40 MG PO TABS
40.0000 mg | ORAL_TABLET | Freq: Every day | ORAL | 0 refills | Status: AC
Start: 2022-08-05 — End: ?
  Filled 2022-08-05: qty 30, 30d supply, fill #0

## 2022-08-05 NOTE — Progress Notes (Signed)
Office: [phone]  /  Fax: [phone]  WEIGHT SUMMARY AND BIOMETRICS  Starting Date: 08/13/21  Starting Weight: 298lb   Weight Lost Since Last Visit: 9lb   Vitals Temp: 97.8 F (36.6 C) BP: 97/66 Pulse Rate: 78 SpO2: 97 %   Body Composition  Body Fat %: 53 % Fat Mass (lbs): 147.8 lbs Muscle Mass (lbs): 124.6 lbs Visceral Fat Rating : 14     HPI  Chief Complaint: OBESITY  Tammy [name] is here to discuss her progress with her obesity treatment plan. She is on the the Category 3 Plan and states she is following her eating plan approximately 75 % of the time. She states she is exercising 0 minutes 0 times per week.   Interval History:  Since last office visit she is down 9 lb She has been doing well on Zepbound 7.5 mg weekly She had a 1-2 week gap without Zepbound  while on vacation Hunger did not increase She is still dealing with financial strain, housing issues with her mom She is having to commute with her job and sometimes doesn't get a lunch break She has added in more high protein snacks She has not yet added in exercise She has a net weight loss of 19 lb in the past year  Pharmacotherapy: Zepbound 7.5 mg weekly  PHYSICAL EXAM:  Blood pressure 97/66, pulse 78, temperature 97.8 F (36.6 C), height 5\' 6"  (1.676 m), weight 279 lb (126.6 kg), SpO2 97%. Body mass index is 45.03 kg/m.  General: She is overweight, cooperative, alert, well developed, and in no acute distress. PSYCH: Has normal mood, affect and thought process.   Lungs: Normal breathing effort, no conversational dyspnea.   ASSESSMENT AND PLAN  TREATMENT PLAN FOR OBESITY:  Recommended Dietary Goals  Tammy [name] is currently in the action stage of change. As such, her goal is to continue weight management plan. She has agreed to the Category 3 Plan.  Behavioral Intervention  We discussed the following Behavioral Modification Strategies today: increasing lean protein intake, decreasing  simple carbohydrates , increasing vegetables, increasing lower glycemic fruits, increasing water intake, keeping healthy foods at home, avoiding temptations and identifying enticing environmental cues, continue to work on implementation of reduced calorie nutritional plan, continue to practice mindfulness when eating, planning for success, and better snacking choices.  Additional resources provided today: NA  Recommended Physical Activity Goals  Tammy [name] has been advised to work up to 150 minutes of moderate intensity aerobic activity a week and strengthening exercises 2-3 times per week for cardiovascular health, weight loss maintenance and preservation of muscle mass.   She has agreed to Think about ways to increase daily physical activity and overcoming barriers to exercise  Pharmacotherapy changes for the treatment of obesity: Zepbound increased to 10 mg weekly  ASSOCIATED CONDITIONS ADDRESSED TODAY  Dyspepsia Assessment & Plan: Worsened by use of Zepbound Did not pick up last month's RX and has only been using OTC antacids prn Avoids late night eating, big portion sizes and high acid foods  Continue behavior changes Restart Famotodine 40 mg daily  Orders: -     Famotidine; Take 1 tablet (40 mg total) by mouth at bedtime.  Dispense: 30 tablet; Refill: 0  Morbid obesity (HCC) with starting BMI 48  Vitamin D deficiency Assessment & Plan: Last vitamin D Lab Results  Component Value Date   VD25OH 29.1 (L) 06/15/2022   She has not been taking a vitamin D supplement and does c/o fatigue  Begin RX vitamin D  weekly Recheck level in 3-4 mos  Orders: -     Vitamin D (Ergocalciferol); Take 1 capsule (50,000 Units total) by mouth every 7 (seven) days.  Dispense: 5 capsule; Refill: 0  Situational stress Assessment & Plan: Work, financial and living stressors continue but she has a good outlook and has been looking at other job opportunities.  She is a good support for her mom.  She  is keeping junk food out of sight and has reduced sweet treats for reward.  Continue to work on sleep, nutrition and self care Keep junk food snacks out of sight Begin walking for stress reduction   Polyphagia -     Zepbound; Inject 10 mg into the skin once a week.  Dispense: 2 mL; Refill: 0      She was informed of the importance of frequent follow up visits to maximize her success with intensive lifestyle modifications for her multiple health conditions.   ATTESTASTION STATEMENTS:  Reviewed by clinician on day of visit: allergies, medications, problem list, medical history, surgical history, family history, social history, and previous encounter notes pertinent to obesity diagnosis.   I have personally spent 30 minutes total time today in preparation, patient care, nutritional counseling and documentation for this visit, including the following: review of clinical lab tests; review of medical tests/procedures/services.      Tammy Brink, DO DABFM, DABOM [facility] [address] [place], [place] [address] [phone]

## 2022-08-05 NOTE — Assessment & Plan Note (Signed)
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  She is a good support for her mom.  She is keeping junk food out of sight and has reduced sweet treats for reward.  Continue to work on sleep, nutrition and self care Keep junk food snacks out of sight Begin walking for stress reduction

## 2022-08-05 NOTE — Assessment & Plan Note (Signed)
Last vitamin D Lab Results  Component Value Date   VD25OH 29.1 (L) 06/15/2022   She has not been taking a vitamin D supplement and does c/o fatigue  Begin RX vitamin D weekly Recheck level in 3-4 mos

## 2022-08-05 NOTE — Assessment & Plan Note (Signed)
Worsened by use of Zepbound Did not pick up last month's RX and has only been using OTC antacids prn Avoids late night eating, big portion sizes and high acid foods  Continue behavior changes Restart Famotodine 40 mg daily

## 2022-08-26 ENCOUNTER — Other Ambulatory Visit (HOSPITAL_BASED_OUTPATIENT_CLINIC_OR_DEPARTMENT_OTHER): Payer: Self-pay

## 2022-08-26 MED ORDER — AMOXICILLIN 250 MG PO CAPS
250.0000 mg | ORAL_CAPSULE | Freq: Three times a day (TID) | ORAL | 0 refills | Status: DC
  Filled 2022-08-26: qty 15, 5d supply, fill #0

## 2022-08-26 MED ORDER — HYDROCODONE-ACETAMINOPHEN 5-325 MG PO TABS
1.0000 | ORAL_TABLET | Freq: Four times a day (QID) | ORAL | 0 refills | Status: DC | PRN
  Filled 2022-08-26: qty 8, 2d supply, fill #0

## 2022-08-26 MED ORDER — CHLORHEXIDINE GLUCONATE 0.12 % MT SOLN
15.0000 mL | Freq: Two times a day (BID) | OROMUCOSAL | 0 refills | Status: DC
  Filled 2022-08-26: qty 473, 16d supply, fill #0

## 2022-08-26 MED ORDER — ONDANSETRON 4 MG PO TBDP
4.0000 mg | ORAL_TABLET | Freq: Three times a day (TID) | ORAL | 0 refills | Status: DC | PRN
  Filled 2022-08-26: qty 6, 2d supply, fill #0

## 2022-08-26 MED ORDER — LORAZEPAM 2 MG PO TABS
2.0000 mg | ORAL_TABLET | Freq: Once | ORAL | 0 refills | Status: AC
  Filled 2022-08-26: qty 1, 1d supply, fill #0

## 2022-08-28 ENCOUNTER — Other Ambulatory Visit (HOSPITAL_BASED_OUTPATIENT_CLINIC_OR_DEPARTMENT_OTHER): Payer: Self-pay

## 2022-08-31 ENCOUNTER — Other Ambulatory Visit (HOSPITAL_BASED_OUTPATIENT_CLINIC_OR_DEPARTMENT_OTHER): Payer: Self-pay

## 2022-09-07 ENCOUNTER — Other Ambulatory Visit (HOSPITAL_BASED_OUTPATIENT_CLINIC_OR_DEPARTMENT_OTHER): Payer: Self-pay

## 2022-09-08 ENCOUNTER — Other Ambulatory Visit (HOSPITAL_BASED_OUTPATIENT_CLINIC_OR_DEPARTMENT_OTHER): Payer: Self-pay

## 2022-09-09 ENCOUNTER — Other Ambulatory Visit (HOSPITAL_BASED_OUTPATIENT_CLINIC_OR_DEPARTMENT_OTHER): Payer: Self-pay

## 2022-09-16 ENCOUNTER — Other Ambulatory Visit (HOSPITAL_BASED_OUTPATIENT_CLINIC_OR_DEPARTMENT_OTHER): Payer: Self-pay

## 2022-09-24 ENCOUNTER — Other Ambulatory Visit (HOSPITAL_BASED_OUTPATIENT_CLINIC_OR_DEPARTMENT_OTHER): Payer: Self-pay

## 2022-10-07 ENCOUNTER — Other Ambulatory Visit (HOSPITAL_BASED_OUTPATIENT_CLINIC_OR_DEPARTMENT_OTHER): Payer: No Typology Code available for payment source

## 2022-10-07 ENCOUNTER — Encounter: Payer: Self-pay | Admitting: Family Medicine

## 2022-10-07 ENCOUNTER — Ambulatory Visit: Payer: No Typology Code available for payment source | Admitting: Family Medicine

## 2022-10-07 ENCOUNTER — Other Ambulatory Visit: Payer: Self-pay

## 2022-10-07 ENCOUNTER — Other Ambulatory Visit (HOSPITAL_BASED_OUTPATIENT_CLINIC_OR_DEPARTMENT_OTHER): Payer: Self-pay

## 2022-10-07 VITALS — BP 143/88 | HR 97 | Temp 98.1°F | Ht 66.0 in | Wt 266.0 lb

## 2022-10-07 DIAGNOSIS — D649 Anemia, unspecified: Secondary | ICD-10-CM

## 2022-10-07 DIAGNOSIS — R632 Polyphagia: Secondary | ICD-10-CM | POA: Diagnosis not present

## 2022-10-07 DIAGNOSIS — R1013 Epigastric pain: Secondary | ICD-10-CM

## 2022-10-07 DIAGNOSIS — D500 Iron deficiency anemia secondary to blood loss (chronic): Secondary | ICD-10-CM

## 2022-10-07 DIAGNOSIS — E559 Vitamin D deficiency, unspecified: Secondary | ICD-10-CM | POA: Diagnosis not present

## 2022-10-07 DIAGNOSIS — E66813 Obesity, class 3: Secondary | ICD-10-CM

## 2022-10-07 DIAGNOSIS — E662 Morbid (severe) obesity with alveolar hypoventilation: Secondary | ICD-10-CM

## 2022-10-07 DIAGNOSIS — Z6841 Body Mass Index (BMI) 40.0 and over, adult: Secondary | ICD-10-CM

## 2022-10-07 LAB — CBC WITH DIFFERENTIAL/PLATELET
Basophils Absolute: 0 10*3/uL (ref 0.0–0.2)
Basos: 0 %
EOS (ABSOLUTE): 0.1 10*3/uL (ref 0.0–0.4)
Eos: 1 %
Hematocrit: 32.9 % — ABNORMAL LOW (ref 34.0–46.6)
Hemoglobin: 10.3 g/dL — ABNORMAL LOW (ref 11.1–15.9)
Immature Grans (Abs): 0 10*3/uL (ref 0.0–0.1)
Immature Granulocytes: 0 %
Lymphocytes Absolute: 3.7 10*3/uL — ABNORMAL HIGH (ref 0.7–3.1)
Lymphs: 54 %
MCH: 28.1 pg (ref 26.6–33.0)
MCHC: 31.3 g/dL — ABNORMAL LOW (ref 31.5–35.7)
MCV: 90 fL (ref 79–97)
Monocytes Absolute: 0.4 10*3/uL (ref 0.1–0.9)
Monocytes: 6 %
Neutrophils Absolute: 2.7 10*3/uL (ref 1.4–7.0)
Neutrophils: 39 %
Platelets: 494 10*3/uL — ABNORMAL HIGH (ref 150–450)
RBC: 3.67 x10E6/uL — ABNORMAL LOW (ref 3.77–5.28)
RDW: 12.7 % (ref 11.7–15.4)
WBC: 7 10*3/uL (ref 3.4–10.8)

## 2022-10-07 MED ORDER — ZEPBOUND 10 MG/0.5ML ~~LOC~~ SOAJ
10.0000 mg | SUBCUTANEOUS | 0 refills | Status: DC
Start: 2022-10-07 — End: 2022-11-08
  Filled 2022-10-07: qty 2, 28d supply, fill #0

## 2022-10-07 MED ORDER — VITAMIN D (ERGOCALCIFEROL) 1.25 MG (50000 UNIT) PO CAPS
50000.0000 [IU] | ORAL_CAPSULE | ORAL | 0 refills | Status: DC
  Filled 2022-10-07: qty 5, 35d supply, fill #0

## 2022-10-07 MED ORDER — FAMOTIDINE 40 MG PO TABS
40.0000 mg | ORAL_TABLET | Freq: Every day | ORAL | 0 refills | Status: DC
  Filled 2022-10-07: qty 30, 30d supply, fill #0

## 2022-10-07 MED ORDER — FERROUS GLUCONATE 324 (38 FE) MG PO TABS
324.0000 mg | ORAL_TABLET | Freq: Every day | ORAL | 0 refills | Status: DC
  Filled 2022-10-07: qty 100, 100d supply, fill #0

## 2022-10-07 NOTE — Assessment & Plan Note (Signed)
Appetite has improved on Zepbound with improved weight reduction Able to maintain most of her lean body mass, prioritizing lean protein intake Avoids meal skipping and nausea/ reflux have improved  Continue Zepbound 10 mg weekly Aim for 100 g of dietary protein daily and increase water intake

## 2022-10-07 NOTE — Progress Notes (Signed)
Office: [phone]  /  Fax: [phone]  WEIGHT SUMMARY AND BIOMETRICS  Starting Date: 08/13/21  Starting Weight: 298lb   Weight Lost Since Last Visit: 13lb   Vitals Temp: 98.1 F (36.7 C) BP: (!) 143/88 Pulse Rate: 97 SpO2: 96 %   Body Composition  Body Fat %: 51.3 % Fat Mass (lbs): 136.4 lbs Muscle Mass (lbs): 123 lbs Visceral Fat Rating : 13    HPI  Chief Complaint: OBESITY  Tammy [name] is here to discuss her progress with her obesity treatment plan. She is on the the Category 3 Plan and states she is following her eating plan approximately 60-70 % of the time. She states she is exercising 0 minutes 0 times per week.   Interval History:  Since last office visit she is down 13 lb This gives her a net weight loss of 31 lb in the past 13 mos She had a 3 week gap without Zebpound 10 mg due to availability and cost Had mild GI upset when restarting 3 weeks ago She has improved satiety with Zepbound 10 mg with smaller portion sizes She is having a grilled chicken scramble from [employer] most mornings, she has sushi for lunch  She has cut back on sweets and doesn't snack much She has braces which limits some of her foods She gets in more veggies with dinner She has struggled to get sleep at night with nighttime awakenings  Pharmacotherapy: Zepbound 10 mg weekly  PHYSICAL EXAM:  Blood pressure (!) 143/88, pulse 97, temperature 98.1 F (36.7 C), height 5\' 6"  (1.676 m), weight 266 lb (120.7 kg), SpO2 96%. Body mass index is 42.93 kg/m.  General: She is overweight, cooperative, alert, well developed, and in no acute distress. PSYCH: Has normal mood, affect and thought process.   Lungs: Normal breathing effort, no conversational dyspnea.   ASSESSMENT AND PLAN  TREATMENT PLAN FOR OBESITY:  Recommended Dietary Goals  Tammy [name] is currently in the action stage of change. As such, her goal is to continue weight management plan. She has agreed to the Category 3  Plan.  Behavioral Intervention  We discussed the following Behavioral Modification Strategies today: increasing lean protein intake, decreasing simple carbohydrates , increasing vegetables, increasing lower glycemic fruits, increasing water intake, work on meal planning and preparation, keeping healthy foods at home, work on managing stress, creating time for self-care and relaxation measures, continue to practice mindfulness when eating, and planning for success.  Additional resources provided today: NA  Recommended Physical Activity Goals  Tammy [name] has been advised to work up to 150 minutes of moderate intensity aerobic activity a week and strengthening exercises 2-3 times per week for cardiovascular health, weight loss maintenance and preservation of muscle mass.   She has agreed to Start aerobic activity with a goal of 150 minutes a week at moderate intensity.   Pharmacotherapy changes for the treatment of obesity: none  ASSOCIATED CONDITIONS ADDRESSED TODAY  Polyphagia Assessment & Plan: Appetite has improved on Zepbound with improved weight reduction Able to maintain most of her lean body mass, prioritizing lean protein intake Avoids meal skipping and nausea/ reflux have improved  Continue Zepbound 10 mg weekly Aim for 100 g of dietary protein daily and increase water intake  Orders: -     Zepbound; Inject 10 mg into the skin once a week.  Dispense: 2 mL; Refill: 0  Dyspepsia -     Famotidine; Take 1 tablet (40 mg total) by mouth at bedtime.  Dispense: 30 tablet;  Refill: 0  Vitamin D deficiency -     Vitamin D (Ergocalciferol); Take 1 capsule (50,000 Units total) by mouth every 7 (seven) days.  Dispense: 5 capsule; Refill: 0 -     VITAMIN D 25 Hydroxy (Vit-D Deficiency, Fractures)  Iron deficiency anemia due to chronic blood loss -     Ferrous Gluconate; Take 1 tablet (324 mg total) by mouth daily.  Dispense: 100 tablet; Refill: 0 -     Ferritin  Class 3 obesity with  alveolar hypoventilation, serious comorbidity, and body mass index (BMI) of 40.0 to 44.9 in adult [facility])      She was informed of the importance of frequent follow up visits to maximize her success with intensive lifestyle modifications for her multiple health conditions.   ATTESTASTION STATEMENTS:  Reviewed by clinician on day of visit: allergies, medications, problem list, medical history, surgical history, family history, social history, and previous encounter notes pertinent to obesity diagnosis.   I have personally spent 30 minutes total time today in preparation, patient care, nutritional counseling and documentation for this visit, including the following: review of clinical lab tests; review of medical tests/procedures/services.      Glennis Brink, DO DABFM, DABOM [facility] [address] [place], [place] [address] [phone]

## 2022-10-08 ENCOUNTER — Other Ambulatory Visit (HOSPITAL_BASED_OUTPATIENT_CLINIC_OR_DEPARTMENT_OTHER): Payer: No Typology Code available for payment source

## 2022-10-08 LAB — FERRITIN: Ferritin: 227 ng/mL — ABNORMAL HIGH (ref 15–150)

## 2022-10-08 LAB — VITAMIN D 25 HYDROXY (VIT D DEFICIENCY, FRACTURES): Vit D, 25-Hydroxy: 25.1 ng/mL — ABNORMAL LOW (ref 30.0–100.0)

## 2022-10-20 ENCOUNTER — Other Ambulatory Visit (HOSPITAL_BASED_OUTPATIENT_CLINIC_OR_DEPARTMENT_OTHER): Payer: Self-pay

## 2022-11-02 ENCOUNTER — Other Ambulatory Visit (HOSPITAL_BASED_OUTPATIENT_CLINIC_OR_DEPARTMENT_OTHER): Payer: Self-pay | Admitting: Family Medicine

## 2022-11-05 ENCOUNTER — Ambulatory Visit: Payer: No Typology Code available for payment source | Admitting: Plastic Surgery

## 2022-11-08 ENCOUNTER — Other Ambulatory Visit (HOSPITAL_BASED_OUTPATIENT_CLINIC_OR_DEPARTMENT_OTHER): Payer: Self-pay

## 2022-11-08 ENCOUNTER — Ambulatory Visit: Payer: No Typology Code available for payment source | Admitting: Family Medicine

## 2022-11-08 ENCOUNTER — Encounter: Payer: Self-pay | Admitting: Family Medicine

## 2022-11-08 VITALS — BP 137/85 | HR 63 | Temp 98.0°F | Ht 66.0 in | Wt 254.0 lb

## 2022-11-08 DIAGNOSIS — F419 Anxiety disorder, unspecified: Secondary | ICD-10-CM | POA: Diagnosis not present

## 2022-11-08 DIAGNOSIS — D500 Iron deficiency anemia secondary to blood loss (chronic): Secondary | ICD-10-CM | POA: Diagnosis not present

## 2022-11-08 DIAGNOSIS — E559 Vitamin D deficiency, unspecified: Secondary | ICD-10-CM

## 2022-11-08 DIAGNOSIS — R632 Polyphagia: Secondary | ICD-10-CM | POA: Diagnosis not present

## 2022-11-08 DIAGNOSIS — E66813 Obesity, class 3: Secondary | ICD-10-CM

## 2022-11-08 DIAGNOSIS — Z6841 Body Mass Index (BMI) 40.0 and over, adult: Secondary | ICD-10-CM

## 2022-11-08 MED ORDER — VITAMIN D (ERGOCALCIFEROL) 1.25 MG (50000 UNIT) PO CAPS
50000.0000 [IU] | ORAL_CAPSULE | ORAL | 0 refills | Status: DC
  Filled 2022-11-08: qty 5, 35d supply, fill #0

## 2022-11-08 MED ORDER — ZEPBOUND 10 MG/0.5ML ~~LOC~~ SOAJ
10.0000 mg | SUBCUTANEOUS | 0 refills | Status: DC
  Filled 2022-11-08 – 2022-11-26 (×2): qty 2, 28d supply, fill #0

## 2022-11-08 NOTE — Progress Notes (Signed)
Office: [phone]  /  Fax: [phone]  WEIGHT SUMMARY AND BIOMETRICS  Starting Date: 08/13/21  Starting Weight: 298lb   Weight Lost Since Last Visit: 12lb   Vitals Temp: 98 F (36.7 C) BP: 137/85 Pulse Rate: 63 SpO2: 98 %   Body Composition  Body Fat %: 49.7 % Fat Mass (lbs): 126.2 lbs Muscle Mass (lbs): 121.4 lbs Total Body Water (lbs): 98.2 lbs Visceral Fat Rating : 12   HPI  Chief Complaint: OBESITY  Karlissa is here to discuss her progress with her obesity treatment plan. She is on the the Category 3 Plan and states she is following her eating plan approximately 50-60 % of the time. She states she is exercising 0 minutes 0 times per week.   Interval History:  Since last office visit she is  down 12 lb This gives her a net weight loss of 43 lb in the past 14 mos of medically supervised weight management.   Hunger and cravings well controlled on Zepbound 10 mg weekly She still has high stress levels She is typically eating 2 meals per day and a protein smoothie daily Time is the factor in not adding in exercise due to work She has some nausea from her Zepbound depending on what she eats  Pharmacotherapy: Zepbound 10 mg weekly  PHYSICAL EXAM:  Blood pressure 137/85, pulse 63, temperature 98 F (36.7 C), height 5\' 6"  (1.676 m), weight 254 lb (115.2 kg), last menstrual period 11/08/2022, SpO2 98%. Body mass index is 41 kg/m.  General: She is overweight, cooperative, alert, well developed, and in no acute distress. PSYCH: Has normal mood, affect and thought process.   Lungs: Normal breathing effort, no conversational dyspnea.   ASSESSMENT AND PLAN  TREATMENT PLAN FOR OBESITY:  Recommended Dietary Goals  Arlean is currently in the action stage of change. As such, her goal is to continue weight management plan. She has agreed to the Category 3 Plan.  Behavioral Intervention  We discussed the following Behavioral Modification Strategies today:  increasing lean protein intake to established goals, increasing vegetables, increasing lower glycemic fruits, keeping healthy foods at home, avoiding temptations and identifying enticing environmental cues, continue to practice mindfulness when eating, planning for success, and continue to work on maintaining a reduced calorie state, getting the recommended amount of protein, incorporating whole foods, making healthy choices, staying well hydrated and practicing mindfulness when eating..  Additional resources provided today: NA  Recommended Physical Activity Goals  Marisela has been advised to work up to 150 minutes of moderate intensity aerobic activity a week and strengthening exercises 2-3 times per week for cardiovascular health, weight loss maintenance and preservation of muscle mass.   She has agreed to [employer] strengthening exercises with a goal of 2-3 sessions a week   Pharmacotherapy changes for the treatment of obesity: none  ASSOCIATED CONDITIONS ADDRESSED TODAY  Polyphagia Assessment & Plan: Improving on Zepbound 10 mg weekly + a higher protein diet, eating on a schedule and adding in more fruits and veggies.  Continue Zepbound 10 mg weekly  Orders: -     Zepbound; Inject 10 mg into the skin once a week.  Dispense: 2 mL; Refill: 0  Vitamin D deficiency Assessment & Plan: Last vitamin D Lab Results  Component Value Date   VD25OH 25.1 (L) 10/07/2022   Reviewed lab from last visit Taking RX vitamin D weekly and has been better about taking this  Repeat lab in 3 mos  Orders: -  Vitamin D (Ergocalciferol); Take 1 capsule (50,000 Units total) by mouth every 7 (seven) days.  Dispense: 5 capsule; Refill: 0  Class 3 severe obesity due to excess calories with serious comorbidity and body mass index (BMI) of 40.0 to 44.9 in adult [facility])  Iron deficiency anemia due to chronic blood loss Assessment & Plan: Reviewed lab from last visit.  Ferritin high and Hgb mildly low,  stable She c/o fatigue and slightly heavy menses No sign of thalassemia or sickle cell based on most recent testing. She did start on ferrous gluconate 324 mg daily  Plan to have full iron panel with next set of labs   Anxiety disorder, unspecified type Assessment & Plan: Worse with chronically poor sleep related to high stress, mostly from work. 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   ATTESTASTION STATEMENTS:  Reviewed by clinician on day of visit: allergies, medications, problem list, medical history, surgical history, family history, social history, and previous encounter notes pertinent to obesity diagnosis.   I have personally spent 30 minutes total time today in preparation, patient care, nutritional counseling and documentation for this visit, including the following: review of clinical lab tests; review of medical tests/procedures/services.      Glennis Brink, DO DABFM, DABOM [facility] [address] [place], [place] [address] [phone]

## 2022-11-08 NOTE — Assessment & Plan Note (Signed)
Improving on Zepbound 10 mg weekly + a higher protein diet, eating on a schedule and adding in more fruits and veggies.  Continue Zepbound 10 mg weekly

## 2022-11-08 NOTE — Assessment & Plan Note (Signed)
Worse with chronically poor sleep related to high stress, mostly from work. 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

## 2022-11-08 NOTE — Assessment & Plan Note (Signed)
Last vitamin D Lab Results  Component Value Date   VD25OH 25.1 (L) 10/07/2022   Reviewed lab from last visit Taking RX vitamin D weekly and has been better about taking this  Repeat lab in 3 mos

## 2022-11-08 NOTE — Assessment & Plan Note (Signed)
Reviewed lab from last visit.  Ferritin high and Hgb mildly low, stable She c/o fatigue and slightly heavy menses No sign of thalassemia or sickle cell based on most recent testing. She did start on ferrous gluconate 324 mg daily  Plan to have full iron panel with next set of labs

## 2022-11-09 ENCOUNTER — Ambulatory Visit: Payer: No Typology Code available for payment source | Admitting: Family Medicine

## 2022-11-11 ENCOUNTER — Other Ambulatory Visit: Payer: Self-pay | Admitting: Family Medicine

## 2022-11-11 ENCOUNTER — Ambulatory Visit: Payer: No Typology Code available for payment source | Admitting: Family Medicine

## 2022-11-11 ENCOUNTER — Encounter: Payer: Self-pay | Admitting: Family Medicine

## 2022-11-11 ENCOUNTER — Telehealth: Payer: Self-pay | Admitting: Family Medicine

## 2022-11-11 VITALS — BP 102/78 | HR 80 | Temp 97.3°F | Resp 18 | Ht 66.0 in | Wt 257.6 lb

## 2022-11-11 DIAGNOSIS — Z7689 Persons encountering health services in other specified circumstances: Secondary | ICD-10-CM | POA: Diagnosis not present

## 2022-11-11 DIAGNOSIS — R5382 Chronic fatigue, unspecified: Secondary | ICD-10-CM

## 2022-11-11 DIAGNOSIS — G479 Sleep disorder, unspecified: Secondary | ICD-10-CM

## 2022-11-11 DIAGNOSIS — R0683 Snoring: Secondary | ICD-10-CM

## 2022-11-11 NOTE — Telephone Encounter (Signed)
Sleep study orders, clinical notes, demographics, and copies of insurance cards have been faxed to [facility] at [phone]. Office will contact patient to complete online registration in order to ship home sleep study equipment.

## 2022-11-11 NOTE — Progress Notes (Signed)
New Patient Office Visit  Subjective    Patient ID: Tammy [name], female    DOB: [birth year]  Age: 22 y.o. MRN: [id number]  CC:  Chief Complaint  Patient presents with   Establish Care    Patient is here to establish care with new PCP, and would like to sleep issues and     HPI Tammy [name] presents to establish care. Pt is new to me.  Sleep issues Pt reports she has frequent episodes of waking up at night. She goes to bed the same time every night around 9-10pm she wakes up at 2am tossing and turning. She wakes up and unsure why. She was taking Trazodone 100 mg that was recently prescribed to her to help with sleep. This doesn't help so she has stopped taking it. She does report snoring at night. No morning headaches. She does report fatigue and the need to take naps during the day. She does report her mother has sleep apnea as well and has to wear a CPAP.  Pt mentions lumps that come up underneath her axilla. None present now. She's also had this in her groin.   Outpatient Encounter Medications as of 11/11/2022  Medication Sig   famotidine (PEPCID) 40 MG tablet Take 1 tablet (40 mg total) by mouth at bedtime.   ferrous gluconate (FERGON) 324 MG tablet Take 1 tablet (324 mg total) by mouth daily.   tirzepatide (ZEPBOUND) 10 MG/0.5ML Pen Inject 10 mg into the skin once a week.   traZODone (DESYREL) 100 MG tablet Take 1 tablet (100 mg total) by mouth at bedtime as needed for sleep.   amoxicillin (AMOXIL) 250 MG capsule Take 1 capsule (250 mg total) by mouth 3 (three) times daily. (Patient not taking: Reported on 11/08/2022)   chlorhexidine (PERIDEX) 0.12 % solution Rinse mouth with 15 mLs 2 (two) times daily after brushing teeth. Start 5 days prior to procedure. (Patient not taking: Reported on 11/11/2022)   doxycycline (VIBRA-TABS) 100 MG tablet Take 1 tablet (100 mg total) by mouth 2 (two) times daily. (Patient not taking: Reported on 11/11/2022)   hydrochlorothiazide (HYDRODIURIL) 12.5  MG tablet Take 1 tablet (12.5 mg total) by mouth daily. (Patient not taking: Reported on 11/11/2022)   HYDROcodone-acetaminophen (NORCO/VICODIN) 5-325 MG tablet Take 1 tablet by mouth every 6 (six) hours as needed for pain (Patient not taking: Reported on 11/11/2022)   ondansetron (ZOFRAN-ODT) 4 MG disintegrating tablet Place 1 tablet (4 mg total) under the tongue and let dissolve every 8 (eight) hours as needed for nausea and vomiting. (Patient not taking: Reported on 11/11/2022)   Vitamin D, Ergocalciferol, (DRISDOL) 1.25 MG (50000 UNIT) CAPS capsule Take 1 capsule (50,000 Units total) by mouth every 7 (seven) days. (Patient not taking: Reported on 11/11/2022)   [DISCONTINUED] norelgestromin-ethinyl estradiol [employer]) 150-35 MCG/24HR transdermal patch Place 1 patch onto the skin once a week.   No facility-administered encounter medications on file as of 11/11/2022.    Past Medical History:  Diagnosis Date   Anemia    Anxiety disorder 03/25/2021   History of anemia    h/o iron infusions x 2   Localized scleroderma (morphea)    Lymphedema    Migraines     Past Surgical History:  Procedure Laterality Date   TONSILLECTOMY      Family History  Problem Relation Age of Onset   Breast cancer Maternal Grandmother    Hypertension Mother    Obesity Mother    Miscarriages / [place] Other  Cancer Other     Social History   Socioeconomic History   Marital status: Single    Spouse name: Not on file   Number of children: Not on file   Years of education: Not on file   Highest education level: Not on file  Occupational History   Not on file  Tobacco Use   Smoking status: Never   Smokeless tobacco: Never  Vaping Use   Vaping status: Never Used  Substance and Sexual Activity   Alcohol use: Never   Drug use: Never   Sexual activity: Not Currently    Birth control/protection: Abstinence  Other Topics Concern   Not on file  Social History Narrative   Not on file   Social  Determinants of Health   Financial Resource Strain: Low Risk  (01/30/2021)   Received from [facility], [facility]   Overall Financial Resource Strain (CARDIA)    Difficulty of Paying Living Expenses: Not very hard  Food Insecurity: No Food Insecurity (01/30/2021)   Received from [facility], [facility]   Hunger Vital Sign    Worried About Running Out of Food in the Last Year: Never true    Ran Out of Food in the Last Year: Never true  Transportation Needs: No Transportation Needs (01/30/2021)   Received from [facility], [facility]   PRAPARE - Transportation    Lack of Transportation (Medical): No    Lack of Transportation (Non-Medical): No  Physical Activity: Unknown (01/30/2021)   Received from [facility], [facility]   Exercise Vital Sign    Days of Exercise per Week: Patient declined    Minutes of Exercise per Session: Patient declined  Stress: No Stress Concern Present (01/30/2021)   Received from [facility], [facility] - Occupational Stress Questionnaire    Feeling of Stress : Only a little  Social Connections: Unknown (05/19/2021)   [facility]   Social Network    Social Network: Not on file  Intimate Partner Violence: Unknown (04/10/2021)   Received from [facility], [facility]   HITS    Physically Hurt: Not on file    Insult or Talk Down To: Not on file    Threaten Physical Harm: Not on file    Scream or Curse: Not on file    Review of Systems  Constitutional:  Positive for malaise/fatigue.  Skin:        Periodic episodes of swollen lumps underneath her axilla; none present today  Psychiatric/Behavioral:         Snoring  All other systems reviewed and are negative.      Objective    LMP 11/08/2022 (Exact Date)   Physical Exam Vitals and nursing note reviewed.  Constitutional:      Appearance: Normal appearance. She is obese.  HENT:     Head:  Normocephalic and atraumatic.     Right Ear: External ear normal.     Left Ear: External ear normal.     Nose: Nose normal.     Mouth/Throat:     Mouth: Mucous membranes are moist.     Pharynx: Oropharynx is clear.  Eyes:     Conjunctiva/sclera: Conjunctivae normal.     Pupils: Pupils are equal, round, and reactive to light.  Cardiovascular:     Rate and Rhythm: Normal rate.  Pulmonary:     Effort: Pulmonary effort is normal.  Skin:    General: Skin is warm.  Capillary Refill: Capillary refill takes less than 2 seconds.  Neurological:     General: No focal deficit present.     Mental Status: She is alert and oriented to person, place, and time. Mental status is at baseline.  Psychiatric:        Mood and Affect: Mood normal.        Behavior: Behavior normal.        Thought Content: Thought content normal.        Judgment: Judgment normal.       Assessment & Plan:   Problem List Items Addressed This Visit   None  Encounter to establish care with new doctor  Snoring -     Home sleep test  Chronic fatigue -     Home sleep test  Sleep disturbances -     Home sleep test  Pt with sleep disturbances, snoring and fatigue; suspect OSA. Will send home with sleep study. Advised pt to follow up with me when she has new lump in axilla to appear.  No follow-ups on file.   Suzan Slick, MD  Total time spent with patient today 34 minutes. This includes reviewing records, evaluating the patient and coordinating care. Face-to-face time >50%.

## 2022-11-12 ENCOUNTER — Ambulatory Visit: Payer: No Typology Code available for payment source | Admitting: Family Medicine

## 2022-11-15 ENCOUNTER — Telehealth (INDEPENDENT_AMBULATORY_CARE_PROVIDER_SITE_OTHER): Payer: Self-pay | Admitting: *Deleted

## 2022-11-15 NOTE — Telephone Encounter (Signed)
PA SUBMITTED VIA [employer] FOR ZEPBOUND.  Tammy [name] (Key: [id number])  Your demographic data has been sent to [facility] successfully!  [employer] typically takes 5-10 minutes to respond, but it may take a little longer in some cases. You will be notified by email when available. You can also check for an update later by opening this request from your dashboard. Please do not fax or call [employer] to resubmit this request. If you need assistance, please chat with [employer] or call us at [phone].  If it has been longer than 24 hours, please reach out to [employer].

## 2022-11-16 ENCOUNTER — Ambulatory Visit: Payer: No Typology Code available for payment source | Admitting: Family Medicine

## 2022-11-16 NOTE — Telephone Encounter (Signed)
Prior authorization was canceled as patient has not been seen since August and canceled last couple appointments. PA will be completed once patient re-establishes with practice.

## 2022-11-18 ENCOUNTER — Other Ambulatory Visit (HOSPITAL_BASED_OUTPATIENT_CLINIC_OR_DEPARTMENT_OTHER): Payer: Self-pay

## 2022-11-22 NOTE — Telephone Encounter (Signed)
Prior authorization approved for patients Zepbound. Patient has been notified.

## 2022-11-22 NOTE — Telephone Encounter (Signed)
Re submitted Prior authorization for patients Zepbound. As she was seen by Dr. Cathey Endow on 11/08/2022. Waiting for determination.

## 2022-11-25 ENCOUNTER — Encounter (HOSPITAL_BASED_OUTPATIENT_CLINIC_OR_DEPARTMENT_OTHER): Payer: Self-pay | Admitting: Family Medicine

## 2022-11-26 ENCOUNTER — Other Ambulatory Visit (HOSPITAL_BASED_OUTPATIENT_CLINIC_OR_DEPARTMENT_OTHER): Payer: Self-pay

## 2022-12-09 ENCOUNTER — Ambulatory Visit: Payer: No Typology Code available for payment source | Admitting: Family Medicine

## 2022-12-13 ENCOUNTER — Ambulatory Visit (INDEPENDENT_AMBULATORY_CARE_PROVIDER_SITE_OTHER): Payer: No Typology Code available for payment source | Admitting: Family Medicine

## 2022-12-13 ENCOUNTER — Encounter: Payer: Self-pay | Admitting: Family Medicine

## 2022-12-13 ENCOUNTER — Other Ambulatory Visit (HOSPITAL_BASED_OUTPATIENT_CLINIC_OR_DEPARTMENT_OTHER): Payer: Self-pay

## 2022-12-13 VITALS — BP 129/78 | HR 80 | Temp 98.0°F | Ht 66.0 in | Wt 247.0 lb

## 2022-12-13 DIAGNOSIS — R1013 Epigastric pain: Secondary | ICD-10-CM | POA: Diagnosis not present

## 2022-12-13 DIAGNOSIS — D500 Iron deficiency anemia secondary to blood loss (chronic): Secondary | ICD-10-CM | POA: Diagnosis not present

## 2022-12-13 DIAGNOSIS — E559 Vitamin D deficiency, unspecified: Secondary | ICD-10-CM | POA: Diagnosis not present

## 2022-12-13 DIAGNOSIS — Z6839 Body mass index (BMI) 39.0-39.9, adult: Secondary | ICD-10-CM

## 2022-12-13 DIAGNOSIS — R632 Polyphagia: Secondary | ICD-10-CM

## 2022-12-13 DIAGNOSIS — E66812 Obesity, class 2: Secondary | ICD-10-CM

## 2022-12-13 MED ORDER — FERROUS GLUCONATE 324 (38 FE) MG PO TABS
324.0000 mg | ORAL_TABLET | Freq: Every day | ORAL | 0 refills | Status: DC
  Filled 2022-12-13: qty 100, 100d supply, fill #0

## 2022-12-13 MED ORDER — VITAMIN D (ERGOCALCIFEROL) 1.25 MG (50000 UNIT) PO CAPS
50000.0000 [IU] | ORAL_CAPSULE | ORAL | 0 refills | Status: DC
  Filled 2022-12-13: qty 5, 35d supply, fill #0

## 2022-12-13 MED ORDER — FAMOTIDINE 40 MG PO TABS
40.0000 mg | ORAL_TABLET | Freq: Every day | ORAL | 0 refills | Status: DC
  Filled 2022-12-13: qty 30, 30d supply, fill #0

## 2022-12-13 MED ORDER — ZEPBOUND 12.5 MG/0.5ML ~~LOC~~ SOAJ
12.5000 mg | SUBCUTANEOUS | 1 refills | Status: DC
  Filled 2022-12-13: qty 2, 28d supply, fill #0

## 2022-12-13 NOTE — Assessment & Plan Note (Signed)
Last vitamin D Lab Results  Component Value Date   VD25OH 25.1 (L) 10/07/2022   She is doing well on vitamin D 50,000 IU once weekly. Energy level is starting to improve  Repeat vitamin D level in February

## 2022-12-13 NOTE — Assessment & Plan Note (Signed)
Lab Results  Component Value Date   IRON 36 06/18/2022   TIBC 361 06/18/2022   FERRITIN 227 (H) 10/07/2022   She is doing well on ferrous gluconate 324 mg once daily.  Her energy level is starting to improve.

## 2022-12-13 NOTE — Progress Notes (Signed)
Office: [phone]  /  Fax: [phone]  WEIGHT SUMMARY AND BIOMETRICS  Starting Date: 08/13/21  Starting Weight: 298lb   Weight Lost Since Last Visit: 7lb   Vitals Temp: 98 F (36.7 C) BP: 129/78 Pulse Rate: 80 SpO2: 99 %   Body Composition  Body Fat %: 48.9 % Fat Mass (lbs): 120.8 lbs Muscle Mass (lbs): 120 lbs Total Body Water (lbs): 97.8 lbs Visceral Fat Rating : 11   HPI  Chief Complaint: OBESITY  Tammy [name] is here to discuss her progress with her obesity treatment plan. She is on the the Category 3 Plan and states she is following her eating plan approximately 60-70 % of the time. She states she is exercising 30 minutes 3 times per week.   Interval History:  Since last office visit she is down 7 lb This gives her a net weight loss of 51 lb in the past 15 mos She is doing well on Zepbound 10 mg weekly She was walking more before the weather became cold Appetite / cravings under good control Mom is also working on weight loss She has been eating out more but is making good choices She denies GI upset while on Zepbound She is looking at getting a [employer]  Pharmacotherapy: Zepbound 10 mg weekly  PHYSICAL EXAM:  Blood pressure 129/78, pulse 80, temperature 98 F (36.7 C), height 5\' 6"  (1.676 m), weight 247 lb (112 kg), SpO2 99%. Body mass index is 39.87 kg/m.  General: She is overweight, cooperative, alert, well developed, and in no acute distress. PSYCH: Has normal mood, affect and thought process.   Lungs: Normal breathing effort, no conversational dyspnea.   ASSESSMENT AND PLAN  TREATMENT PLAN FOR OBESITY:  Recommended Dietary Goals  Tammy [name] is currently in the action stage of change. As such, her goal is to continue weight management plan. She has agreed to the Category 3 Plan.  Behavioral Intervention  We discussed the following Behavioral Modification Strategies today: increasing lean protein intake to established goals, increasing fiber  rich foods, increasing water intake , work on meal planning and preparation, keeping healthy foods at home, identifying sources and decreasing liquid calories, avoiding temptations and identifying enticing environmental cues, planning for success, and continue to work on maintaining a reduced calorie state, getting the recommended amount of protein, incorporating whole foods, making healthy choices, staying well hydrated and practicing mindfulness when eating..  Additional resources provided today: NA  Recommended Physical Activity Goals  Tammy [name] has been advised to work up to 150 minutes of moderate intensity aerobic activity a week and strengthening exercises 2-3 times per week for cardiovascular health, weight loss maintenance and preservation of muscle mass.   She has agreed to Think about enjoyable ways to increase daily physical activity and overcoming barriers to exercise and Increase physical activity in their day and reduce sedentary time (increase NEAT). Recommend adding in some home exercise options.    Pharmacotherapy changes for the treatment of obesity: Increase Zepbound to 12.5 mg once weekly injection  ASSOCIATED CONDITIONS ADDRESSED TODAY  Iron deficiency anemia due to chronic blood loss Assessment & Plan: Lab Results  Component Value Date   IRON 36 06/18/2022   TIBC 361 06/18/2022   FERRITIN 227 (H) 10/07/2022   She is doing well on ferrous gluconate 324 mg once daily.  Her energy level is starting to improve.    Orders: -     Ferrous Gluconate; Take 1 tablet (324 mg total) by mouth daily.  Dispense: 100 tablet; Refill:  0  Dyspepsia -     Famotidine; Take 1 tablet (40 mg total) by mouth at bedtime.  Dispense: 30 tablet; Refill: 0  Polyphagia  Vitamin D deficiency Assessment & Plan: Last vitamin D Lab Results  Component Value Date   VD25OH 25.1 (L) 10/07/2022   She is doing well on vitamin D 50,000 IU once weekly. Energy level is starting to  improve  Repeat vitamin D level in February  Orders: -     Vitamin D (Ergocalciferol); Take 1 capsule (50,000 Units total) by mouth every 7 (seven) days.  Dispense: 5 capsule; Refill: 0  Class 2 obesity due to excess calories with body mass index (BMI) of 39.0 to 39.9 in adult, unspecified whether serious comorbidity present -     Zepbound; Inject 12.5 mg into the skin once a week.  Dispense: 2 mL; Refill: 1      She was informed of the importance of frequent follow up visits to maximize her success with intensive lifestyle modifications for her multiple health conditions.   ATTESTASTION STATEMENTS:  Reviewed by clinician on day of visit: allergies, medications, problem list, medical history, surgical history, family history, social history, and previous encounter notes pertinent to obesity diagnosis.   I have personally spent 30 minutes total time today in preparation, patient care, nutritional counseling and documentation for this visit, including the following: review of clinical lab tests; review of medical tests/procedures/services.      Tammy Brink, DO DABFM, DABOM [facility] [address] [place], [place] [address] [phone]

## 2022-12-13 NOTE — Telephone Encounter (Signed)
12/13/2022-Received incoming fax from [facility] stating that the office has not been able to reach patient to schedule Home sleep study equipment appointment. Documentation has been scanned into patients chart.

## 2022-12-20 ENCOUNTER — Ambulatory Visit: Payer: No Typology Code available for payment source | Admitting: Family Medicine

## 2022-12-22 ENCOUNTER — Other Ambulatory Visit (HOSPITAL_BASED_OUTPATIENT_CLINIC_OR_DEPARTMENT_OTHER): Payer: Self-pay

## 2023-01-10 ENCOUNTER — Ambulatory Visit: Payer: No Typology Code available for payment source | Admitting: Family Medicine

## 2023-01-13 ENCOUNTER — Other Ambulatory Visit (HOSPITAL_BASED_OUTPATIENT_CLINIC_OR_DEPARTMENT_OTHER): Payer: Self-pay

## 2023-01-13 ENCOUNTER — Encounter: Payer: Self-pay | Admitting: Family Medicine

## 2023-01-13 ENCOUNTER — Ambulatory Visit: Payer: No Typology Code available for payment source | Admitting: Family Medicine

## 2023-01-13 VITALS — BP 127/84 | HR 71 | Temp 97.3°F | Ht 66.0 in | Wt 238.0 lb

## 2023-01-13 DIAGNOSIS — E66812 Obesity, class 2: Secondary | ICD-10-CM | POA: Diagnosis not present

## 2023-01-13 DIAGNOSIS — Z6838 Body mass index (BMI) 38.0-38.9, adult: Secondary | ICD-10-CM | POA: Diagnosis not present

## 2023-01-13 DIAGNOSIS — Z566 Other physical and mental strain related to work: Secondary | ICD-10-CM

## 2023-01-13 DIAGNOSIS — E6609 Other obesity due to excess calories: Secondary | ICD-10-CM

## 2023-01-13 DIAGNOSIS — E559 Vitamin D deficiency, unspecified: Secondary | ICD-10-CM

## 2023-01-13 DIAGNOSIS — D500 Iron deficiency anemia secondary to blood loss (chronic): Secondary | ICD-10-CM

## 2023-01-13 MED ORDER — ZEPBOUND 12.5 MG/0.5ML ~~LOC~~ SOAJ
12.5000 mg | SUBCUTANEOUS | 1 refills | Status: DC
  Filled 2023-01-13: qty 2, 28d supply, fill #0

## 2023-01-13 MED ORDER — VITAMIN D (ERGOCALCIFEROL) 1.25 MG (50000 UNIT) PO CAPS
50000.0000 [IU] | ORAL_CAPSULE | ORAL | 0 refills | Status: DC
  Filled 2023-01-13: qty 5, 35d supply, fill #0

## 2023-01-13 NOTE — Progress Notes (Signed)
 Office: [phone]  /  Fax: [phone]  WEIGHT SUMMARY AND BIOMETRICS  Starting Date: 08/13/21  Starting Weight: 298lb   Weight Lost Since Last Visit: 9lb   Vitals Temp: (!) 97.3 F (36.3 C) BP: 127/84 Pulse Rate: 71 SpO2: 97 %   Body Composition  Body Fat %: 49.2 % Fat Mass (lbs): 117 lbs Muscle Mass (lbs): 114.8 lbs Total Body Water (lbs): 96.6 lbs Visceral Fat Rating : 11    HPI  Chief Complaint: OBESITY  Tammy [name] is here to discuss her progress with her obesity treatment plan. She is on the the Category 3 Plan and states she is following her eating plan approximately 75-80 % of the time. She states she is exercising 0 minutes 0 times per week.  Interval History:  Since last office visit she is down 9 lb [name] a net weight loss of 60 lb in the past 16 mos This is a 20% TBW loss in 16 mos of medically supervised weight management [name] enough satiety from Zepbound  12.5 mg weekly She is eating more seafood, rice, veggies, fruit She is not liking greek yogurt, protein shakes She is eating more sushi lately She is eating out more due to her work schedule She is commuting more for work  Pharmacotherapy: Zepbound  12.5 mg weekly  PHYSICAL EXAM:  Blood pressure 127/84, pulse 71, temperature (!) 97.3 F (36.3 C), height 5' 6 (1.676 m), weight 238 lb (108 kg), SpO2 97%. Body mass index is 38.41 kg/m.  General: She is overweight, cooperative, alert, well developed, and in no acute distress. PSYCH: [name] normal mood, affect and thought process.   Lungs: Normal breathing effort, no conversational dyspnea.   ASSESSMENT AND PLAN  TREATMENT PLAN FOR OBESITY:  Recommended Dietary Goals  Tammy [name] is currently in the action stage of change. As such, her goal is to continue weight management plan. [name] agreed to [facility] plan  Behavioral Intervention  We discussed the following Behavioral Modification Strategies today: increasing lean protein intake to  established goals, increasing vegetables, increasing lower glycemic fruits, increasing water intake , work on meal planning and preparation, keeping healthy foods at home, practice mindfulness eating and understand the difference between hunger signals and cravings, work on managing stress, creating time for self-care and relaxation, avoiding temptations and identifying enticing environmental cues, and continue to work on maintaining a reduced calorie state, getting the recommended amount of protein, incorporating whole foods, making healthy choices, staying well hydrated and practicing mindfulness when eating.. Reduce frequency of restaurant meals that are causing most of her GI distress from Zepbound   Additional resources provided today: NA  Recommended Physical Activity Goals  Tammy [name] advised to work up to 150 minutes of moderate intensity aerobic activity a week and strengthening exercises 2-3 times per week for cardiovascular health, weight loss maintenance and preservation of muscle mass.   [name] agreed to Think about enjoyable ways to increase daily physical activity and overcoming barriers to exercise and Increase physical activity in their day and reduce sedentary time (increase NEAT). Time with her long commute is the biggest barrier to exercise  Pharmacotherapy changes for the treatment of obesity: none  ASSOCIATED CONDITIONS ADDRESSED TODAY  Vitamin D  deficiency Assessment & Plan: Last vitamin D  Lab Results  Component Value Date   VD25OH 25.1 (L) 10/07/2022   She is doing well on vitamin D  50,000 IU once weekly.  Her energy level is starting to improve.  She is tolerating her prescription vitamin D   well.  Repeat vitamin D  level in February  Orders: -     Vitamin D  (Ergocalciferol ); Take 1 capsule (50,000 Units total) by mouth every 7 (seven) days.  Dispense: 5 capsule; Refill: 0  Class 2 obesity due to excess calories with body mass index (BMI) of 38.0 to 38.9 in  adult, unspecified whether serious comorbidity present -     Zepbound ; Inject 12.5 mg into the skin once a week.  Dispense: 2 mL; Refill: 1  Iron  deficiency anemia due to chronic blood loss Assessment & Plan: She is doing well on ferrous gluconate  324 mg once daily for iron  deficiency anemia secondary to dysfunctional uterine bleeding.  [name] felt fatigued but denies any dyspnea on exertion.  [name] not been back to see her OB/GYN regarding her heavy menses.  Continue ferrous gluconate  324 mg once daily and repeat iron  levels next visit   Stressful job Assessment & Plan: Stress at work [name] higher.  [name] commuting to [place] for work.  She is living with her mom which [name] also been a source of her stress.  Her long commute [name] limited her time for meal planning and exercise.  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  She is able to move out on her own by summer.  Continue to work on self-care, adequate sleep, good nutrition and exercise when possible.       She was informed of the importance of frequent follow up visits to maximize her success with intensive lifestyle modifications for her multiple health conditions.   ATTESTASTION STATEMENTS:  Reviewed by clinician on day of visit: allergies, medications, problem list, medical history, surgical history, family history, social history, and previous encounter notes pertinent to obesity diagnosis.   I have personally spent 30 minutes total time today in preparation, patient care, nutritional counseling and documentation for this visit, including the following: review of clinical lab tests; review of medical tests/procedures/services.      Darice FORBES Haddock, DO DABFM, DABOM [facility] [address] [place], [place] [address] [phone]

## 2023-01-13 NOTE — Assessment & Plan Note (Signed)
 Last vitamin D  Lab Results  Component Value Date   VD25OH 25.1 (L) 10/07/2022   She is doing well on vitamin D  50,000 IU once weekly.  Her energy level is starting to improve.  She is tolerating her prescription vitamin D  well.  Repeat vitamin D  level in February

## 2023-01-13 NOTE — Assessment & Plan Note (Signed)
 She is doing well on ferrous gluconate  324 mg once daily for iron  deficiency anemia secondary to dysfunctional uterine bleeding.  She has felt fatigued but denies any dyspnea on exertion.  She has not been back to see her OB/GYN regarding her heavy menses.  Continue ferrous gluconate  324 mg once daily and repeat iron  levels next visit

## 2023-01-13 NOTE — Assessment & Plan Note (Signed)
 Stress at work has been higher.  She has been commuting to [place] for work.  She is living with her mom which has also been a source of her stress.  Her long commute has limited her time for meal planning and exercise.  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  She is able to move out on her own by summer.  Continue to work on self-care, adequate sleep, good nutrition and exercise when possible.

## 2023-01-17 ENCOUNTER — Encounter: Payer: Self-pay | Admitting: Family Medicine

## 2023-01-17 ENCOUNTER — Ambulatory Visit: Payer: No Typology Code available for payment source | Admitting: Family Medicine

## 2023-01-17 ENCOUNTER — Other Ambulatory Visit (HOSPITAL_BASED_OUTPATIENT_CLINIC_OR_DEPARTMENT_OTHER): Payer: Self-pay

## 2023-01-17 VITALS — BP 124/86 | HR 75 | Temp 98.0°F | Resp 18 | Ht 66.0 in | Wt 240.0 lb

## 2023-01-17 DIAGNOSIS — L0292 Furuncle, unspecified: Secondary | ICD-10-CM | POA: Diagnosis not present

## 2023-01-17 DIAGNOSIS — R8290 Unspecified abnormal findings in urine: Secondary | ICD-10-CM

## 2023-01-17 MED ORDER — CEPHALEXIN 500 MG PO CAPS
500.0000 mg | ORAL_CAPSULE | Freq: Two times a day (BID) | ORAL | 0 refills | Status: AC
  Filled 2023-01-17: qty 14, 7d supply, fill #0

## 2023-01-17 NOTE — Progress Notes (Signed)
 Acute Office Visit  Subjective:     Patient ID: Tammy [name], female    DOB: 2000/05/17, 23 y.o.   MRN: [id number]  Chief Complaint  Patient presents with   Urinary Tract Infection    Patient states that her symptoms started back in October with lower abdominal pain, but since then she hasn't had any pain just her urine has a foul odor.    Blister    Patient would also like to discuss a painful boil she found on her buttocks this morning     Urinary Tract Infection  Patient is in today for acute visit.  Pt reports a painful boil to her buttock. She reports she noted it this morning. Painful to sit and touch. Nondraining. She reports she's had boils in the past but near her vagina. She had been on Doxycycline  in the past but this didn't work well with her.   Pt also reports lower pelvic pain in October. She reports now she has urine odor. No dysuria or pain with urination. No hematuria.   Review of Systems  Skin:        boil  All other systems reviewed and are negative.      Objective:    BP 124/86   Pulse 75   Temp 98 F (36.7 C) (Oral)   Resp 18   Ht 5' 6 (1.676 m)   Wt 240 lb (108.9 kg)   SpO2 99%   BMI 38.74 kg/m  BP Readings from Last 3 Encounters:  01/17/23 124/86  01/13/23 127/84  12/13/22 129/78      Physical Exam Vitals and nursing note reviewed. Exam conducted with a chaperone present.  Constitutional:      Appearance: Normal appearance. She is normal weight.  HENT:     Head: Normocephalic and atraumatic.     Right Ear: External ear normal.     Left Ear: External ear normal.     Nose: Nose normal.     Mouth/Throat:     Mouth: Mucous membranes are moist.     Pharynx: Oropharynx is clear.  Eyes:     Conjunctiva/sclera: Conjunctivae normal.     Pupils: Pupils are equal, round, and reactive to light.  Cardiovascular:     Rate and Rhythm: Normal rate.  Pulmonary:     Effort: Pulmonary effort is normal.  Genitourinary:    Comments: Boil near  anus measuring 3x2 cm Skin:    General: Skin is warm.     Capillary Refill: Capillary refill takes less than 2 seconds.  Neurological:     General: No focal deficit present.     Mental Status: She is alert and oriented to person, place, and time. Mental status is at baseline.  Psychiatric:        Mood and Affect: Mood normal.        Behavior: Behavior normal.        Thought Content: Thought content normal.        Judgment: Judgment normal.   No results found for any visits on 01/17/23.      Assessment & Plan:   Problem List Items Addressed This Visit   None Visit Diagnoses       UTI symptoms    -  Primary   Relevant Orders   POCT URINALYSIS DIP (CLINITEK)     Boil -     Cephalexin ; Take 1 capsule (500 mg total) by mouth 2 (two) times daily for 7 days.  Dispense: 14 capsule;  Refill: 0  Abnormal urine odor   Boil near anus. To do hot sitz baths and Keflex  500mg  BID x 7 days.  For strong urine odor, advised to drink more water. Likely dehydrated.   No orders of the defined types were placed in this encounter.   No follow-ups on file.  Torrence CINDERELLA Barrier, MD

## 2023-01-21 ENCOUNTER — Ambulatory Visit (HOSPITAL_BASED_OUTPATIENT_CLINIC_OR_DEPARTMENT_OTHER): Payer: No Typology Code available for payment source

## 2023-01-28 ENCOUNTER — Ambulatory Visit: Payer: No Typology Code available for payment source | Admitting: Plastic Surgery

## 2023-01-28 VITALS — BP 124/77 | HR 75 | Ht 66.0 in | Wt 238.4 lb

## 2023-01-28 DIAGNOSIS — M542 Cervicalgia: Secondary | ICD-10-CM

## 2023-01-28 DIAGNOSIS — G8929 Other chronic pain: Secondary | ICD-10-CM

## 2023-01-28 DIAGNOSIS — N62 Hypertrophy of breast: Secondary | ICD-10-CM | POA: Diagnosis not present

## 2023-01-28 DIAGNOSIS — M546 Pain in thoracic spine: Secondary | ICD-10-CM | POA: Diagnosis not present

## 2023-01-28 NOTE — Progress Notes (Signed)
   Subjective:    Patient ID: Tammy [name], female    DOB: [birth year], 23 y.o.   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  She is 5 feet 6 inches tall and when I saw her in June she was 291 pounds.  That was an improvement from 305 pounds.  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  She is now 238 pounds.  She is hoping to lose more.  She still has the neck pain back pain and her breasts have not improved at all.      Review of Systems  Constitutional: Negative.   Eyes: Negative.   Respiratory: Negative.    Cardiovascular: Negative.   Gastrointestinal: Negative.   Endocrine: Negative.   Genitourinary: Negative.   Musculoskeletal: Negative.        Objective:   Physical Exam Constitutional:      Appearance: Normal appearance.  Cardiovascular:     Rate and Rhythm: Normal rate.     Pulses: Normal pulses.  Pulmonary:     Effort: Pulmonary effort is normal.  Skin:    General: Skin is warm.     Capillary Refill: Capillary refill takes less than 2 seconds.  Neurological:     Mental Status: She is oriented to person, place, and time.  Psychiatric:        Mood and Affect: Mood normal.        Behavior: Behavior normal.        Thought Content: Thought content normal.        Judgment: Judgment normal.        Assessment & Plan:     ICD-10-CM   1. 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 Chronic bilateral thoracic back pain  M54.6    G89.29       Plan follow up in 3 months as she is doing an amazing job on decreasing her weight and is planning to decrease it by 30-40 pounds. Patient in agreement.  Pictures were obtained of the patient and placed in the chart with the patient's or guardian's permission.

## 2023-02-10 ENCOUNTER — Encounter: Payer: Self-pay | Admitting: Family Medicine

## 2023-02-15 ENCOUNTER — Encounter: Payer: Self-pay | Admitting: Family Medicine

## 2023-02-15 ENCOUNTER — Other Ambulatory Visit (HOSPITAL_BASED_OUTPATIENT_CLINIC_OR_DEPARTMENT_OTHER): Payer: Self-pay

## 2023-02-15 ENCOUNTER — Ambulatory Visit (INDEPENDENT_AMBULATORY_CARE_PROVIDER_SITE_OTHER): Payer: No Typology Code available for payment source | Admitting: Family Medicine

## 2023-02-15 VITALS — BP 118/80 | HR 87 | Temp 98.0°F | Ht 66.0 in | Wt 229.0 lb

## 2023-02-15 DIAGNOSIS — E88819 Insulin resistance, unspecified: Secondary | ICD-10-CM | POA: Diagnosis not present

## 2023-02-15 DIAGNOSIS — R1013 Epigastric pain: Secondary | ICD-10-CM | POA: Diagnosis not present

## 2023-02-15 DIAGNOSIS — E559 Vitamin D deficiency, unspecified: Secondary | ICD-10-CM | POA: Diagnosis not present

## 2023-02-15 DIAGNOSIS — D500 Iron deficiency anemia secondary to blood loss (chronic): Secondary | ICD-10-CM

## 2023-02-15 DIAGNOSIS — Z6838 Body mass index (BMI) 38.0-38.9, adult: Secondary | ICD-10-CM

## 2023-02-15 DIAGNOSIS — R5382 Chronic fatigue, unspecified: Secondary | ICD-10-CM

## 2023-02-15 DIAGNOSIS — E66812 Obesity, class 2: Secondary | ICD-10-CM

## 2023-02-15 MED ORDER — FAMOTIDINE 40 MG PO TABS
40.0000 mg | ORAL_TABLET | Freq: Every day | ORAL | 0 refills | Status: DC
  Filled 2023-02-15: qty 30, 30d supply, fill #0

## 2023-02-15 MED ORDER — VITAMIN D (ERGOCALCIFEROL) 1.25 MG (50000 UNIT) PO CAPS
50000.0000 [IU] | ORAL_CAPSULE | ORAL | 0 refills | Status: DC
  Filled 2023-02-15: qty 5, 35d supply, fill #0

## 2023-02-15 MED ORDER — FERROUS GLUCONATE 324 (38 FE) MG PO TABS
324.0000 mg | ORAL_TABLET | Freq: Every day | ORAL | 0 refills | Status: DC
  Filled 2023-02-15: qty 100, 100d supply, fill #0

## 2023-02-15 MED ORDER — ZEPBOUND 12.5 MG/0.5ML ~~LOC~~ SOAJ
12.5000 mg | SUBCUTANEOUS | 1 refills | Status: DC
  Filled 2023-02-15: qty 2, 28d supply, fill #0

## 2023-02-15 NOTE — Progress Notes (Signed)
Office: [phone]  /  Fax: [phone]  WEIGHT SUMMARY AND BIOMETRICS  Starting Date: 08/13/21  Starting Weight: 298lb   Weight Lost Since Last Visit: 9lb   Vitals Temp: 98 F (36.7 C) BP: 118/80 Pulse Rate: 87 SpO2: 97 %   Body Composition  Body Fat %: 45.8 % Fat Mass (lbs): 105 lbs Muscle Mass (lbs): 118 lbs Total Body Water (lbs): 89.8 lbs Visceral Fat Rating : 9    HPI  Chief Complaint: OBESITY  Tammy [name] is here to discuss her progress with her obesity treatment plan. She is on the the Category 3 Plan and states she is following her eating plan approximately 60 % of the time. She states she is exercising 60 minutes 2 times per week.   Interval History:  Since last office visit she is down 9 lb Her muscle mass is back up 3.2 lb and body fat down 12 lb since last visit She has a net weight loss of 69 lb in 17 mos of medically supervised weight management This is a 23% TBW loss [facility] to bring her brother to add in weight training She is no longer commuting to [place] for work She still eats out most of her meals She is mindful of portion sizes and food choices She is due for labs but declined today since it is her birthday  Pharmacotherapy: Zepbound 12.5 mg weekly  PHYSICAL EXAM:  Blood pressure 118/80, pulse 87, temperature 98 F (36.7 C), height 5\' 6"  (1.676 m), weight 229 lb (103.9 kg), SpO2 97%. Body mass index is 36.96 kg/m.  General: She is overweight, cooperative, alert, well developed, and in no acute distress. PSYCH: Has normal mood, affect and thought process.   Lungs: Normal breathing effort, no conversational dyspnea.   ASSESSMENT AND PLAN  TREATMENT PLAN FOR OBESITY:  Recommended Dietary Goals  Tammy [name] is currently in the action stage of change. As such, her goal is to continue weight management plan. She has agreed to practicing portion control and making smarter food choices, such  as increasing vegetables and decreasing simple carbohydrates.  Behavioral Intervention  We discussed the following Behavioral Modification Strategies today: increasing lean protein intake to established goals, increasing fiber rich foods, increasing water intake , work on meal planning and preparation, keeping healthy foods at home, work on managing stress, creating time for self-care and relaxation, avoiding temptations and identifying enticing environmental cues, planning for success, and continue to work on maintaining a reduced calorie state, getting the recommended amount of protein, incorporating whole foods, making healthy choices, staying well hydrated and practicing mindfulness when eating..  Additional resources provided today: NA  Recommended Physical Activity Goals  Tammy [name] has been advised to work up to 150 minutes of moderate intensity aerobic activity a week and strengthening exercises 2-3 times per week for cardiovascular health, weight loss maintenance and preservation of muscle mass.   She has agreed to [employer] strengthening exercises with a goal of 2-3 sessions a week  and Increase the intensity, frequency or duration of aerobic exercises    Pharmacotherapy changes for the treatment of obesity: none  ASSOCIATED CONDITIONS ADDRESSED TODAY  Insulin resistance -     Insulin, random  Class 2 obesity due to excess calories with body mass index (BMI) of 38.0 to 38.9 in adult, unspecified whether serious comorbidity present -     Zepbound; Inject 12.5 mg into the skin once a week.  Dispense: 2 mL;  Refill: 1  Vitamin D deficiency -     Vitamin D (Ergocalciferol); Take 1 capsule (50,000 Units total) by mouth every 7 (seven) days.  Dispense: 5 capsule; Refill: 0 -     VITAMIN D 25 Hydroxy (Vit-D Deficiency, Fractures)  Dyspepsia -     Famotidine; Take 1 tablet (40 mg total) by mouth at bedtime.  Dispense: 30 tablet; Refill: 0  Iron deficiency anemia due to chronic blood loss -      Ferrous Gluconate; Take 1 tablet (324 mg total) by mouth daily.  Dispense: 100 tablet; Refill: 0 -     CBC -     Ferritin -     Iron and TIBC  Chronic fatigue -     Comprehensive metabolic panel      She was informed of the importance of frequent follow up visits to maximize her success with intensive lifestyle modifications for her multiple health conditions.   ATTESTASTION STATEMENTS:  Reviewed by clinician on day of visit: allergies, medications, problem list, medical history, surgical history, family history, social history, and previous encounter notes pertinent to obesity diagnosis.   I have personally spent 30 minutes total time today in preparation, patient care, nutritional counseling and documentation for this visit, including the following: review of clinical lab tests; review of medical tests/procedures/services.      Glennis Brink, DO DABFM, DABOM [facility] [address] [place], [place] [address] [phone]

## 2023-02-17 ENCOUNTER — Ambulatory Visit: Payer: No Typology Code available for payment source | Admitting: Family Medicine

## 2023-02-24 ENCOUNTER — Other Ambulatory Visit (HOSPITAL_BASED_OUTPATIENT_CLINIC_OR_DEPARTMENT_OTHER): Payer: Self-pay

## 2023-03-04 ENCOUNTER — Other Ambulatory Visit (HOSPITAL_BASED_OUTPATIENT_CLINIC_OR_DEPARTMENT_OTHER): Payer: Self-pay

## 2023-03-04 MED ORDER — IBUPROFEN 600 MG PO TABS
600.0000 mg | ORAL_TABLET | Freq: Four times a day (QID) | ORAL | 0 refills | Status: DC | PRN
Start: 2023-03-04 — End: 2023-04-27
  Filled 2023-03-04: qty 32, 8d supply, fill #0

## 2023-03-04 MED ORDER — HYDROCODONE-ACETAMINOPHEN 5-325 MG PO TABS
1.0000 | ORAL_TABLET | Freq: Four times a day (QID) | ORAL | 0 refills | Status: DC | PRN
  Filled 2023-03-04: qty 5, 2d supply, fill #0

## 2023-03-04 MED ORDER — CHLORHEXIDINE GLUCONATE 0.12 % MT SOLN
15.0000 mL | Freq: Two times a day (BID) | OROMUCOSAL | 1 refills | Status: DC
  Filled 2023-03-04: qty 473, 16d supply, fill #0

## 2023-03-04 MED ORDER — AMOXICILLIN 875 MG PO TABS
875.0000 mg | ORAL_TABLET | Freq: Two times a day (BID) | ORAL | 0 refills | Status: DC
  Filled 2023-03-04: qty 14, 7d supply, fill #0

## 2023-03-09 ENCOUNTER — Other Ambulatory Visit (HOSPITAL_BASED_OUTPATIENT_CLINIC_OR_DEPARTMENT_OTHER): Payer: Self-pay

## 2023-03-09 MED ORDER — METHYLPREDNISOLONE 4 MG PO TBPK
ORAL_TABLET | ORAL | 0 refills | Status: DC
Start: 2023-03-09 — End: 2023-04-27
  Filled 2023-03-09: qty 21, 6d supply, fill #0

## 2023-03-09 MED ORDER — IBUPROFEN 800 MG PO TABS
800.0000 mg | ORAL_TABLET | Freq: Four times a day (QID) | ORAL | 0 refills | Status: DC
  Filled 2023-03-09: qty 30, 8d supply, fill #0

## 2023-03-09 MED ORDER — METHOCARBAMOL 500 MG PO TABS
500.0000 mg | ORAL_TABLET | Freq: Four times a day (QID) | ORAL | 0 refills | Status: DC
  Filled 2023-03-09: qty 30, 8d supply, fill #0

## 2023-03-16 ENCOUNTER — Ambulatory Visit: Payer: No Typology Code available for payment source | Admitting: Family Medicine

## 2023-03-17 ENCOUNTER — Ambulatory Visit: Admitting: Family Medicine

## 2023-03-21 ENCOUNTER — Encounter: Payer: Self-pay | Admitting: Family Medicine

## 2023-03-21 ENCOUNTER — Ambulatory Visit: Admitting: Family Medicine

## 2023-03-21 ENCOUNTER — Other Ambulatory Visit (HOSPITAL_BASED_OUTPATIENT_CLINIC_OR_DEPARTMENT_OTHER): Payer: Self-pay

## 2023-03-21 VITALS — BP 135/78 | HR 102 | Temp 98.1°F | Ht 66.0 in | Wt 225.0 lb

## 2023-03-21 DIAGNOSIS — E559 Vitamin D deficiency, unspecified: Secondary | ICD-10-CM

## 2023-03-21 DIAGNOSIS — Z566 Other physical and mental strain related to work: Secondary | ICD-10-CM

## 2023-03-21 DIAGNOSIS — E66812 Obesity, class 2: Secondary | ICD-10-CM

## 2023-03-21 DIAGNOSIS — D500 Iron deficiency anemia secondary to blood loss (chronic): Secondary | ICD-10-CM | POA: Diagnosis not present

## 2023-03-21 DIAGNOSIS — E88819 Insulin resistance, unspecified: Secondary | ICD-10-CM | POA: Diagnosis not present

## 2023-03-21 DIAGNOSIS — Z6836 Body mass index (BMI) 36.0-36.9, adult: Secondary | ICD-10-CM

## 2023-03-21 MED ORDER — ZEPBOUND 12.5 MG/0.5ML ~~LOC~~ SOAJ
12.5000 mg | SUBCUTANEOUS | 0 refills | Status: DC
  Filled 2023-03-21: qty 2, 28d supply, fill #0

## 2023-03-21 NOTE — Progress Notes (Signed)
 Office: [phone]  /  Fax: [phone]  WEIGHT SUMMARY AND BIOMETRICS  Starting Date: 08/13/21  Starting Weight: 298lb   Weight Lost Since Last Visit: 4lb   Vitals Temp: 98.1 F (36.7 C) BP: 135/78 Pulse Rate: (!) 102 SpO2: 98 %   Body Composition  Body Fat %: 46.3 % Fat Mass (lbs): 104.4 lbs Muscle Mass (lbs): 115 lbs Total Body Water (lbs): 91.8 lbs Visceral Fat Rating : 9   HPI  Chief Complaint: OBESITY  Tammy [name] is here to discuss her progress with her obesity treatment plan. She is on the the Category 3 Plan and states she is following her eating plan approximately 30 % of the time. She states she is exercising 0 minutes 0 times per week.  Interval History:  Since last office visit she is down 4 lb  She is down 3 pounds of muscle mass and down 0.6 pounds of body fat since her last visit This gives her a net weight loss of 73 lb in the past 18 mos This is a 24.5% total body weight loss She had 4 teeth pulled and hasn't been able to workout x 2 weeks She was eating a soft diet She is looking at getting a Peloton She is leaving her job on Friday and is looking for another job She will be moving with her mom She has not been taking her vitamin D lately  Pharmacotherapy: Zepbound 12.5 mg once weekly injection  PHYSICAL EXAM:  Blood pressure 135/78, pulse (!) 102, temperature 98.1 F (36.7 C), height 5\' 6"  (1.676 m), weight 225 lb (102.1 kg), SpO2 98%. Body mass index is 36.32 kg/m.  General: She is overweight, cooperative, alert, well developed, and in no acute distress. PSYCH: Has normal mood, affect and thought process.   Lungs: Normal breathing effort, no conversational dyspnea.   ASSESSMENT AND PLAN  TREATMENT PLAN FOR OBESITY:  Recommended Dietary Goals  Tammy [name] is currently in the action stage of change. As such, her goal is to continue weight management plan. She has agreed to the Category 3 Plan and practicing portion control and making  smarter food choices, such as increasing vegetables and decreasing simple carbohydrates.  Behavioral Intervention  We discussed the following Behavioral Modification Strategies today: increasing lean protein intake to established goals, increasing fiber rich foods, increasing water intake , work on meal planning and preparation, keeping healthy foods at home, identifying sources and decreasing liquid calories, work on managing stress, creating time for self-care and relaxation, avoiding temptations and identifying enticing environmental cues, planning for success, and continue to work on maintaining a reduced calorie state, getting the recommended amount of protein, incorporating whole foods, making healthy choices, staying well hydrated and practicing mindfulness when eating..  Additional resources provided today: NA  Recommended Physical Activity Goals  Tammy [name] has been advised to work up to 150 minutes of moderate intensity aerobic activity a week and strengthening exercises 2-3 times per week for cardiovascular health, weight loss maintenance and preservation of muscle mass.   She has agreed to Start aerobic activity with a goal of 150 minutes a week at moderate intensity.   Pharmacotherapy changes for the treatment of obesity: None  ASSOCIATED CONDITIONS ADDRESSED TODAY  Insulin resistance Patient has a history of insulin resistance, not on metformin.  She is working on weight reduction and has lost about 25% of her total body weight in the past 18 months of medically supervised weight management.  She has done well on Zepbound for obesity management.  Overall, she has reduced her intake of added sugar and refined carbohydrates and has room for improvement with consistent exercise.  Repeat labs today Class 2 obesity due to excess calories with body mass index (BMI) of 36.0 to 36.9 in adult, unspecified whether serious comorbidity present -     Zepbound; Inject 12.5 mg into the skin once a  week.  Dispense: 2 mL; Refill: 0 Reviewed her recent muscle loss.  This is likely due to inadequate protein intake since her dental extraction and lack of exercise over the past 3 weeks.  She plans to resume walking and weight training at the gym once she is out of work.  We set a daily protein intake goal of 100 g/day.  Vitamin D deficiency She has inconsistently been taking her vitamin D 50,000 IU once weekly.  Her energy level remains fairly low.  Her last vitamin D level 5 months ago 25.1.  Continue on vitamin D 50,000 IU once weekly and recheck labs today  Iron deficiency anemia due to chronic blood loss She has been taking ferrous gluconate 324 mg once daily for iron deficiency is due to dysfunctional uterine bleeding.  Her energy level remains fairly low.  She is tolerating prescription oral iron well.  Recheck iron levels today.  Stressful job She is leaving her stressful job on Friday, currently looking for work.  Her mom has been supportive and she will be moving residences as well.  She has incorporated some stress eating over time but has better control with use of Zepbound.  Continue to focus on self-care, adequate sleep at night and good nutrition without meal skipping incorporating lean protein and fiber with meals.  She is hopeful to find a better job with less stress.     She was informed of the importance of frequent follow up visits to maximize her success with intensive lifestyle modifications for her multiple health conditions.   ATTESTASTION STATEMENTS:  Reviewed by clinician on day of visit: allergies, medications, problem list, medical history, surgical history, family history, social history, and previous encounter notes pertinent to obesity diagnosis.   I have personally spent 30 minutes total time today in preparation, patient care, nutritional counseling and documentation for this visit, including the following: review of clinical lab tests; review of medical  tests/procedures/services.      Glennis Brink, DO DABFM, DABOM [facility] [address] [place], [place] [address] [phone]

## 2023-03-23 LAB — COMPREHENSIVE METABOLIC PANEL
ALT: 8 IU/L (ref 0–32)
AST: 13 IU/L (ref 0–40)
Albumin: 4.3 g/dL (ref 4.0–5.0)
Alkaline Phosphatase: 63 IU/L (ref 44–121)
BUN/Creatinine Ratio: 13 (ref 9–23)
BUN: 9 mg/dL (ref 6–20)
Bilirubin Total: 0.5 mg/dL (ref 0.0–1.2)
CO2: 21 mmol/L (ref 20–29)
Calcium: 9.6 mg/dL (ref 8.7–10.2)
Chloride: 105 mmol/L (ref 96–106)
Creatinine, Ser: 0.72 mg/dL (ref 0.57–1.00)
Globulin, Total: 2.7 g/dL (ref 1.5–4.5)
Glucose: 71 mg/dL (ref 70–99)
Potassium: 3.7 mmol/L (ref 3.5–5.2)
Sodium: 141 mmol/L (ref 134–144)
Total Protein: 7 g/dL (ref 6.0–8.5)
eGFR: 120 mL/min/{1.73_m2} (ref >59)

## 2023-03-23 LAB — CBC
Hematocrit: 32.1 % — ABNORMAL LOW (ref 34.0–46.6)
Hemoglobin: 10.4 g/dL — ABNORMAL LOW (ref 11.1–15.9)
MCH: 29.4 pg (ref 26.6–33.0)
MCHC: 32.4 g/dL (ref 31.5–35.7)
MCV: 91 fL (ref 79–97)
Platelets: 491 10*3/uL — ABNORMAL HIGH (ref 150–450)
RBC: 3.54 x10E6/uL — ABNORMAL LOW (ref 3.77–5.28)
RDW: 12 % (ref 11.7–15.4)
WBC: 6.7 10*3/uL (ref 3.4–10.8)

## 2023-03-23 LAB — IRON AND TIBC
Iron Saturation: 20 % (ref 15–55)
Iron: 59 ug/dL (ref 27–159)
Total Iron Binding Capacity: 293 ug/dL (ref 250–450)
UIBC: 234 ug/dL (ref 131–425)

## 2023-03-23 LAB — INSULIN, RANDOM: INSULIN: 11.2 u[IU]/mL (ref 2.6–24.9)

## 2023-03-23 LAB — VITAMIN D 25 HYDROXY (VIT D DEFICIENCY, FRACTURES): Vit D, 25-Hydroxy: 19.5 ng/mL — ABNORMAL LOW (ref 30.0–100.0)

## 2023-03-23 LAB — FERRITIN: Ferritin: 207 ng/mL — ABNORMAL HIGH (ref 15–150)

## 2023-04-01 ENCOUNTER — Other Ambulatory Visit (HOSPITAL_COMMUNITY)
Admission: RE | Admit: 2023-04-01 | Discharge: 2023-04-01 | Disposition: A | Discharge status: Home / Self Care | Source: Ambulatory Visit | Attending: Obstetrics & Gynecology | Admitting: Obstetrics & Gynecology

## 2023-04-01 ENCOUNTER — Ambulatory Visit (HOSPITAL_BASED_OUTPATIENT_CLINIC_OR_DEPARTMENT_OTHER): Payer: No Typology Code available for payment source | Admitting: Obstetrics & Gynecology

## 2023-04-01 ENCOUNTER — Encounter (HOSPITAL_BASED_OUTPATIENT_CLINIC_OR_DEPARTMENT_OTHER): Payer: Self-pay | Admitting: Obstetrics & Gynecology

## 2023-04-01 VITALS — BP 118/71 | HR 77 | Ht 66.0 in | Wt 227.6 lb

## 2023-04-01 DIAGNOSIS — Z113 Encounter for screening for infections with a predominantly sexual mode of transmission: Secondary | ICD-10-CM

## 2023-04-01 DIAGNOSIS — N898 Other specified noninflammatory disorders of vagina: Secondary | ICD-10-CM | POA: Diagnosis not present

## 2023-04-01 DIAGNOSIS — Z01419 Encounter for gynecological examination (general) (routine) without abnormal findings: Secondary | ICD-10-CM

## 2023-04-01 NOTE — Progress Notes (Signed)
 ANNUAL EXAM Patient name: Tammy [name] MRN [id number]  Date of birth: 24-Aug-2000 Chief Complaint:   Annual Exam  History of Present Illness:   Tammy [name] is a 23 y.o. G0P0000 African-American female being seen today for a routine annual exam.  Is SA.  Needs STI testing.    Cycles are much improved.  Flow are regular and lasts 7 days.  She is having a lot less cramping.  She does still have nausea on the first day.  Using condoms.    Has some bumps on her breasts for me to examine.  Has been having some issues with recurrent furuncles in her axilla.  Last issue was about two months ago in January.  Had to be seen for this.    Patient's last menstrual period was 03/18/2023.   Upstream - 04/01/23 0859       Pregnancy Intention Screening   Does the patient want to become pregnant in the next year? No    Does the patient's partner want to become pregnant in the next year? No    Would the patient like to discuss contraceptive options today? No             Last pap 03/19/2022. Results were:  negative . H/O abnormal pap: no Last mammogram: guidelines reviewed.  MGM diagnosed in her 57's. Last colonoscopy: guidelines reviewed      04/01/2023    9:00 AM 11/11/2022    1:57 PM 07/16/2022    9:01 AM 07/01/2022    4:51 PM 03/19/2022   10:07 AM  Depression screen PHQ 2/9  Decreased Interest 0 0 0 0 0  Down, Depressed, Hopeless 0 1 0 0 0  PHQ - 2 Score 0 1 0 0 0  Altered sleeping  2     Tired, decreased energy  0     Change in appetite  1     Feeling bad or failure about yourself   0     Trouble concentrating  0     Moving slowly or fidgety/restless  0     Suicidal thoughts  0     PHQ-9 Score  4     Difficult doing work/chores  Somewhat difficult       Review of Systems:   Pertinent items are noted in HPI  Denies any abnormal vaginal discharge, pelvic pain, abnormal bleeding.  Pertinent History Reviewed:  Reviewed past medical,surgical, social and family history.  Reviewed  problem list, medications and allergies. Physical Assessment:   Vitals:   04/01/23 0853  BP: 118/71  Pulse: 77  Weight: 227 lb 9.6 oz (103.2 kg)  Height: 5\' 6"  (1.676 m)  Body mass index is 36.74 kg/m.        Physical Examination:   General appearance - well appearing, and in no distress  Mental status - alert, oriented to person, place, and time  Psych:  She has a normal mood and affect  Skin - warm and dry, normal color, no suspicious lesions noted  Chest - effort normal, all lung fields clear to auscultation bilaterally  Heart - normal rate and regular rhythm  Neck:  midline trachea, no thyromegaly or nodules  Breasts - breasts appear normal, no suspicious masses, no skin or nipple changes or  axillary nodes  Abdomen - soft, nontender, nondistended, no masses or organomegaly  Pelvic - VULVA: normal appearing vulva with no masses, tenderness or lesions  VAGINA: normal appearing vagina with normal color and discharge, no lesions  CERVIX:  normal appearing cervix without discharge or lesions, no CMT  Thin prep pap is not done.  UTERUS: uterus is felt to be normal size, shape, consistency and nontender   ADNEXA: No adnexal masses or tenderness noted.  Rectal - deferred  Extremities:  No swelling or varicosities noted  Chaperone present for exam  Assessment & Plan:  1. Well woman exam with routine gynecological exam (Primary) - Pap smear 2024 - Mammogram guidelines reviewed - Colonoscopy guidelines reviewed - lab work done with PCP - vaccines reviewed/updated  2. Screening examination for STI - HEP, RPR, HIV Panel - Hepatitis C antibody - Cervicovaginal ancillary only( [place])  3. Vaginal discharge - Cervicovaginal ancillary only( [place])   Orders Placed This Encounter  Procedures   HEP, RPR, HIV Panel   Hepatitis C antibody    Meds: No orders of the defined types were placed in this encounter.   Follow-up: Return in about 1 year (around  03/31/2024).  Jerene Bears, MD 04/01/2023 9:39 AM

## 2023-04-02 LAB — HEP, RPR, HIV PANEL
HIV Screen 4th Generation wRfx: NONREACTIVE
Hepatitis B Surface Ag: NEGATIVE
RPR Ser Ql: NONREACTIVE

## 2023-04-02 LAB — HEPATITIS C ANTIBODY: Hep C Virus Ab: NONREACTIVE

## 2023-04-04 ENCOUNTER — Encounter (HOSPITAL_BASED_OUTPATIENT_CLINIC_OR_DEPARTMENT_OTHER): Payer: Self-pay | Admitting: Obstetrics & Gynecology

## 2023-04-04 LAB — CERVICOVAGINAL ANCILLARY ONLY
Bacterial Vaginitis (gardnerella): POSITIVE — AB
Candida Glabrata: NEGATIVE
Candida Vaginitis: NEGATIVE
Chlamydia: NEGATIVE
Comment: NEGATIVE
Comment: NEGATIVE
Comment: NEGATIVE
Comment: NEGATIVE
Comment: NEGATIVE
Comment: NORMAL
Neisseria Gonorrhea: NEGATIVE
Trichomonas: NEGATIVE

## 2023-04-06 ENCOUNTER — Other Ambulatory Visit (HOSPITAL_BASED_OUTPATIENT_CLINIC_OR_DEPARTMENT_OTHER): Payer: Self-pay

## 2023-04-06 MED ORDER — METRONIDAZOLE 500 MG PO TABS
500.0000 mg | ORAL_TABLET | Freq: Two times a day (BID) | ORAL | 0 refills | Status: DC
  Filled 2023-04-06: qty 14, 7d supply, fill #0

## 2023-04-06 NOTE — Addendum Note (Signed)
 Addended by: Jerene Bears on: 04/06/2023 01:24 PM   Modules accepted: Orders

## 2023-04-14 ENCOUNTER — Ambulatory Visit: Admitting: Family Medicine

## 2023-04-26 ENCOUNTER — Ambulatory Visit: Payer: No Typology Code available for payment source | Admitting: Physician Assistant

## 2023-04-27 ENCOUNTER — Encounter: Payer: Self-pay | Admitting: Family Medicine

## 2023-04-27 ENCOUNTER — Other Ambulatory Visit (HOSPITAL_BASED_OUTPATIENT_CLINIC_OR_DEPARTMENT_OTHER): Payer: Self-pay

## 2023-04-27 ENCOUNTER — Ambulatory Visit (INDEPENDENT_AMBULATORY_CARE_PROVIDER_SITE_OTHER): Admitting: Family Medicine

## 2023-04-27 VITALS — BP 113/81 | HR 85 | Temp 98.2°F | Ht 66.0 in | Wt 227.0 lb

## 2023-04-27 DIAGNOSIS — Z6836 Body mass index (BMI) 36.0-36.9, adult: Secondary | ICD-10-CM

## 2023-04-27 DIAGNOSIS — F419 Anxiety disorder, unspecified: Secondary | ICD-10-CM

## 2023-04-27 DIAGNOSIS — E66812 Obesity, class 2: Secondary | ICD-10-CM | POA: Diagnosis not present

## 2023-04-27 DIAGNOSIS — E559 Vitamin D deficiency, unspecified: Secondary | ICD-10-CM | POA: Diagnosis not present

## 2023-04-27 DIAGNOSIS — D500 Iron deficiency anemia secondary to blood loss (chronic): Secondary | ICD-10-CM | POA: Diagnosis not present

## 2023-04-27 MED ORDER — VITAMIN D (ERGOCALCIFEROL) 1.25 MG (50000 UNIT) PO CAPS
50000.0000 [IU] | ORAL_CAPSULE | ORAL | 0 refills | Status: DC
  Filled 2023-04-27: qty 5, 35d supply, fill #0

## 2023-04-27 MED ORDER — FERROUS GLUCONATE 324 (38 FE) MG PO TABS
324.0000 mg | ORAL_TABLET | Freq: Every day | ORAL | 0 refills | Status: DC
  Filled 2023-04-27: qty 100, 100d supply, fill #0

## 2023-04-27 MED ORDER — ZEPBOUND 12.5 MG/0.5ML ~~LOC~~ SOAJ
12.5000 mg | SUBCUTANEOUS | 0 refills | Status: DC
  Filled 2023-04-27 – 2023-05-23 (×2): qty 2, 28d supply, fill #0

## 2023-04-27 NOTE — Patient Instructions (Signed)
 Track daily calorie intake using the MyFitnessPal ap Aim for 1800 cal/ day This should include 90-120 g of protein intake daily  Work on [employer] choices when eating out Try out a protein shake instead of skipping breakfast Bring fresh fruit to work for a quick snack  Aim for a good workout (walking or Peloton) at least 3 days/ wk  Hydrate well with water

## 2023-04-27 NOTE — Progress Notes (Signed)
 Office: [phone]  /  Fax: [phone]  WEIGHT SUMMARY AND BIOMETRICS  Starting Date: 08/13/21  Starting Weight: 298lb   Weight Lost Since Last Visit: 0   Vitals Temp: 98.2 F (36.8 C) BP: 113/81 Pulse Rate: 85 SpO2: 100 %   Body Composition  Body Fat %: 46.7 % Fat Mass (lbs): 106.2 lbs Muscle Mass (lbs): 114.8 lbs Total Body Water (lbs): 96.6 lbs Visceral Fat Rating : 9    HPI  Chief Complaint: OBESITY  Tammy [name] is here to discuss her progress with her obesity treatment plan. She is on the the Category 3 Plan and states she is following her eating plan approximately 20 % of the time. She states she is exercising walking 15 minutes 2 or 3 times per week.  Interval History:  Since last office visit she is up 2 lb This gives her a net weight loss of 71 pounds in the past 19 months medically supervised weight management This is a 23.8% total body weight loss She did a church fast for 2 weeks and was eating all of her food after 3pm She did skip a dose of Zepbound  and did notice an increase in hunger She did get off track with a road trip indulging more She is back to eating on a schedule She denies GI upset from Zepbound  She is still eating out daily and has struggled with her sleep schedule She did get a Peloton bike and is walking a mile on work days She started a new job but is still looking for an alternative option.  Stress levels remain high.  Pharmacotherapy: Zepbound  12.5 mg once weekly injection  PHYSICAL EXAM:  Blood pressure 113/81, pulse 85, temperature 98.2 F (36.8 C), height 5\' 6"  (1.676 m), weight 227 lb (103 kg), last menstrual period 04/20/2023, SpO2 100%. Body mass index is 36.64 kg/m.  General: She is overweight, cooperative, alert, well developed, and in no acute distress. PSYCH: Has normal mood, affect and thought process.   Lungs: Normal breathing effort, no conversational dyspnea.   ASSESSMENT AND PLAN  TREATMENT PLAN FOR  OBESITY:  Recommended Dietary Goals  Tammy [name] is currently in the action stage of change. As such, her goal is to continue weight management plan. She has agreed to keeping a food journal and adhering to recommended goals of 1600 calories and 100 g of protein and practicing portion control and making smarter food choices, such as increasing vegetables and decreasing simple carbohydrates.  Behavioral Intervention  We discussed the following Behavioral Modification Strategies today: increasing lean protein intake to established goals, increasing fiber rich foods, increasing water intake , work on meal planning and preparation, work on Counselling psychologist calories using tracking application, keeping healthy foods at home, practice mindfulness eating and understand the difference between hunger signals and cravings, work on managing stress, creating time for self-care and relaxation, avoiding temptations and identifying enticing environmental cues, planning for success, and continue to work on maintaining a reduced calorie state, getting the recommended amount of protein, incorporating whole foods, making healthy choices, staying well hydrated and practicing mindfulness when eating..  Additional resources provided today: NA  Recommended Physical Activity Goals  Tammy [name] has been advised to work up to 150 minutes of moderate intensity aerobic activity a week and strengthening exercises 2-3 times per week for cardiovascular health, weight loss maintenance and preservation of muscle mass.   She has agreed to Work on scheduling and tracking physical activity.  Reviewed diet and exercise Change goals together and  after visit summary  Pharmacotherapy changes for the treatment of obesity: None  ASSOCIATED CONDITIONS ADDRESSED TODAY  Anxiety disorder, unspecified type Stress levels remain high with change of job.  She has had some emotional eating and poor food choices in relation to mood  She has a  good support system at home We discussed the importance of stress reduction, proper sleep at night, good nutrition and regular exercise.  Keep junk food trigger foods out of the house.  She may do better with outside counseling.  Class 2 obesity due to excess calories with body mass index (BMI) of 36.0 to 36.9 in adult, unspecified whether serious comorbidity present -     Zepbound ; Inject 12.5 mg into the skin once a week.  Dispense: 2 mL; Refill: 0  Vitamin D  deficiency Last vitamin D  Lab Results  Component Value Date   VD25OH 19.5 (L) 03/21/2023  She is doing well on vitamin D  50,000 IU once weekly.  Repeat lab in the next 3 months She admits to poor compliance taking vitamin D  the preceding  months -     Vitamin D  (Ergocalciferol ); Take 1 capsule (50,000 Units total) by mouth every 7 (seven) days.  Dispense: 5 capsule; Refill: 0  Iron  deficiency anemia due to chronic blood loss Reviewed labs from last visit.  Her iron  saturation improved from 10-20.  Hemoglobin remained stable at 10.4, up from 10.3.  Ferritin remains high, likely from inflammatory process.  She is doing well on ferrous gluconate  324 mg tab without adverse side effects.  She plans to talk to her OB/GYN about her heavy menses. -     Ferrous Gluconate ; Take 1 tablet (324 mg total) by mouth daily.  Dispense: 100 tablet; Refill: 0      She was informed of the importance of frequent follow up visits to maximize her success with intensive lifestyle modifications for her multiple health conditions.   ATTESTASTION STATEMENTS:  Reviewed by clinician on day of visit: allergies, medications, problem list, medical history, surgical history, family history, social history, and previous encounter notes pertinent to obesity diagnosis.   I have personally spent 30 minutes total time today in preparation, patient care, nutritional counseling and education,  and documentation for this visit, including the following: review of most  recent clinical lab tests, prescribing medications/ refilling medications, reviewing medical assistant documentation, review and interpretation of bioimpedence results.     Micky Albee, D.O. DABFM, DABOM [facility] [address] [place], [place] [address] [phone]

## 2023-05-02 ENCOUNTER — Ambulatory Visit: Admitting: Family Medicine

## 2023-05-02 ENCOUNTER — Ambulatory Visit: Admitting: Physician Assistant

## 2023-05-06 ENCOUNTER — Ambulatory Visit: Admitting: Plastic Surgery

## 2023-05-06 VITALS — BP 123/86 | HR 76 | Ht 66.0 in | Wt 228.2 lb

## 2023-05-06 DIAGNOSIS — M546 Pain in thoracic spine: Secondary | ICD-10-CM | POA: Diagnosis not present

## 2023-05-06 DIAGNOSIS — N62 Hypertrophy of breast: Secondary | ICD-10-CM | POA: Diagnosis not present

## 2023-05-06 DIAGNOSIS — M542 Cervicalgia: Secondary | ICD-10-CM | POA: Diagnosis not present

## 2023-05-06 DIAGNOSIS — G8929 Other chronic pain: Secondary | ICD-10-CM

## 2023-05-06 NOTE — Progress Notes (Signed)
   Subjective:    Patient ID: Tammy [name], female    DOB: [birth year], 23 y.o.   MRN: [id number]  The patient is a 23 year old female here for follow-up on her breast.  She is 5 feet 6 inches tall and weighs 228 pounds.  Her body mass index is 36.8 kg/m.  The amount estimated for me to be able to remove from her breasts is 900 g from each breast.  She is still having back and neck pain.  She is hoping to be around a C cup.  She has tried conservative measures without help or relief of her pain.      Review of Systems  Constitutional:  Positive for activity change. Negative for appetite change.  Eyes: Negative.   Respiratory: Negative.    Cardiovascular: Negative.   Gastrointestinal: Negative.   Endocrine: Negative.   Genitourinary: Negative.   Musculoskeletal:  Positive for back pain and neck pain.  Skin:  Positive for rash.       Objective:   Physical Exam Vitals reviewed.  Constitutional:      Appearance: Normal appearance.  HENT:     Head: Atraumatic.  Cardiovascular:     Rate and Rhythm: Normal rate.  Pulmonary:     Effort: Pulmonary effort is normal.  Skin:    Capillary Refill: Capillary refill takes less than 2 seconds.  Neurological:     Mental Status: She is alert and oriented to person, place, and time.  Psychiatric:        Mood and Affect: Mood normal.        Behavior: Behavior normal.        Thought Content: Thought content normal.        Judgment: Judgment normal.        Assessment & Plan:     ICD-10-CM   1. 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 Chronic bilateral thoracic back pain  M54.6    G89.29        The patient is aware amputation technique might be needed.  She also is aware that she may not be able to breast-feed in the future and her nipple areola sensation can change significantly.  Will submit for bilateral breast reduction with liposuction.

## 2023-05-09 ENCOUNTER — Other Ambulatory Visit (HOSPITAL_BASED_OUTPATIENT_CLINIC_OR_DEPARTMENT_OTHER): Payer: Self-pay

## 2023-05-21 IMAGING — CT CT ABD-PELV W/ CM
1 of 2 series · 14 of 32 positions shown, 18 images · IV contrast (agent unspecified)
Comparison: Pelvic ultrasound 04/01/2021

CLINICAL DATA: Hyperinsulinemia. Hypoglycemia due to insulin. Acute
left flank pain. Pelvic mass in female. Pelvic mass detected on
ultrasound.

EXAM:
CT ABDOMEN AND PELVIS WITH CONTRAST
TECHNIQUE: Multidetector CT imaging of the abdomen and pelvis was performed
using the standard protocol following bolus administration of
intravenous contrast.

[Series 2: a/p w/ 5mm · axial · 0.98mm/px · z∈[+822,+1266]mm · 14 of 99 slices shown, 18 images]
[im 5/99  soft-tissue]
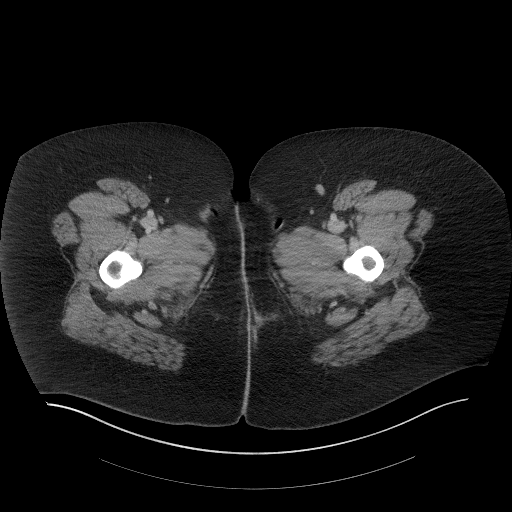
[im 5/99  bone]
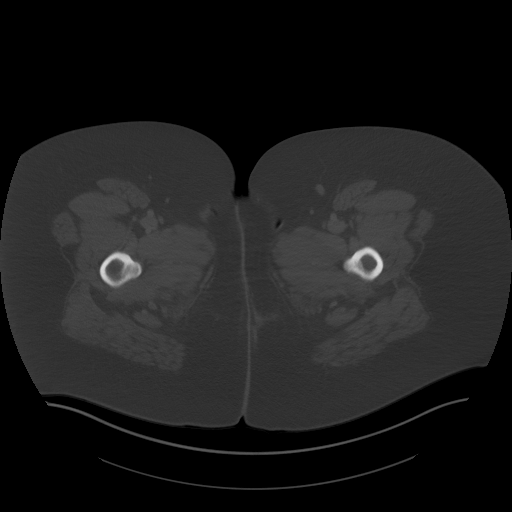
[im 13/99  soft-tissue]
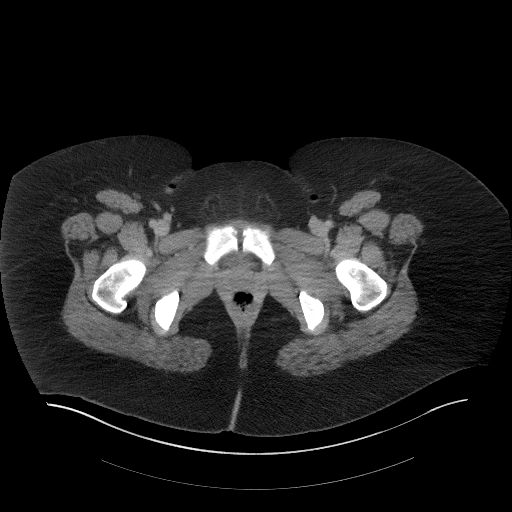
[im 21/99  soft-tissue]
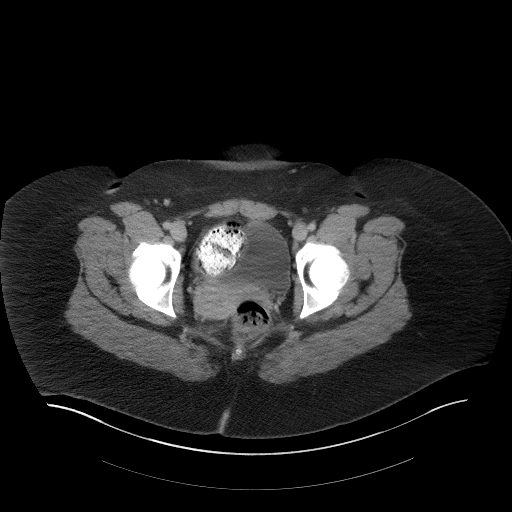
[im 29/99  soft-tissue]
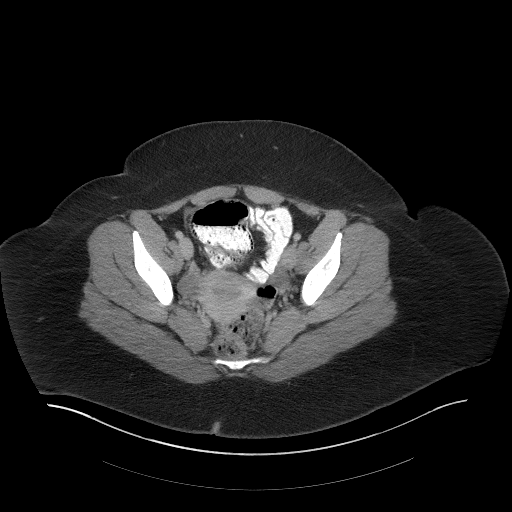
[im 37/99  soft-tissue]
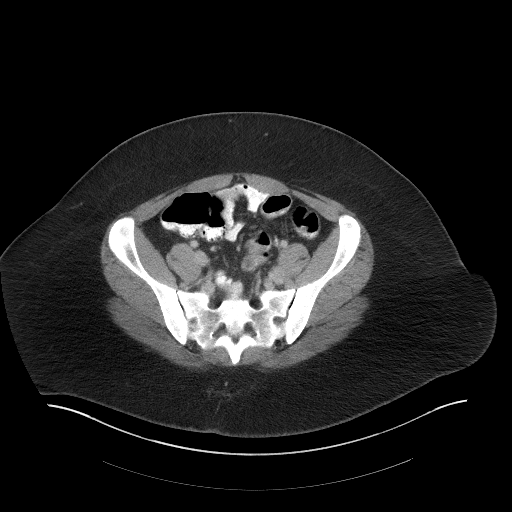
[im 45/99  soft-tissue]
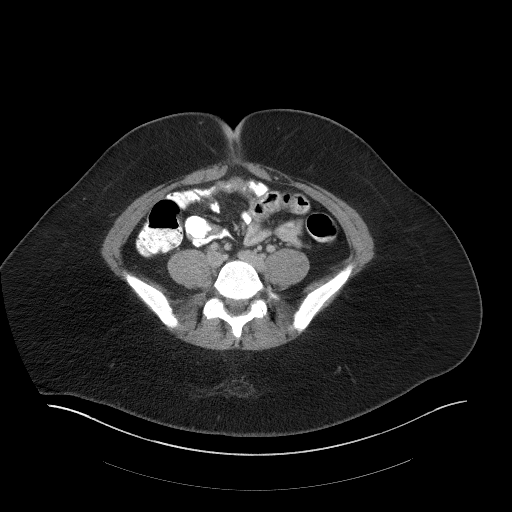
[im 54/99  soft-tissue]
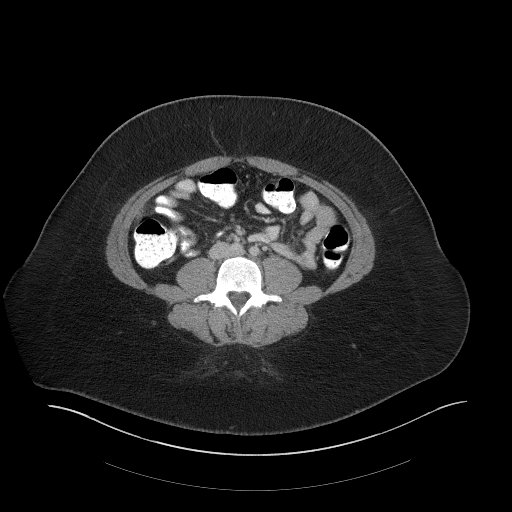
[im 62/99  soft-tissue]
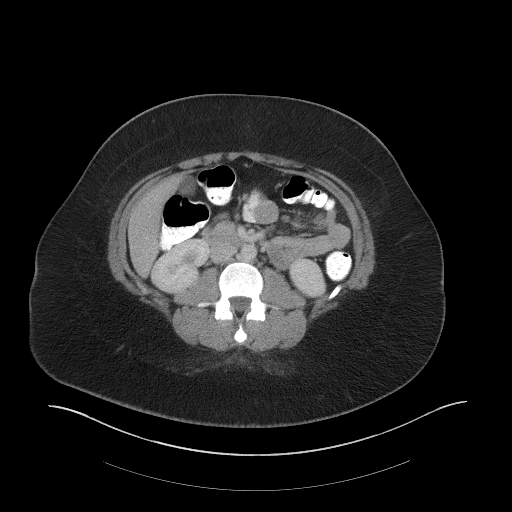
[im 70/99  soft-tissue]
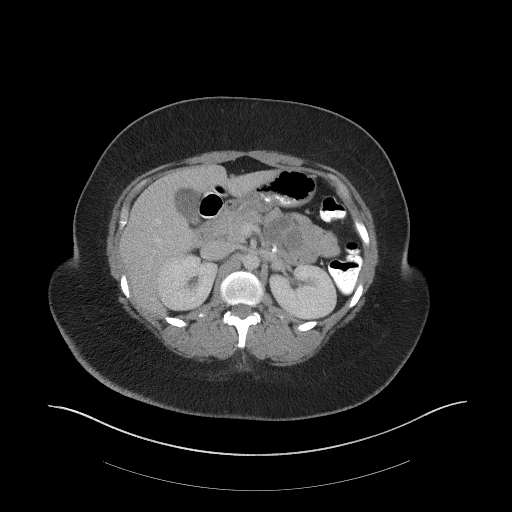
[im 70/99  bone]
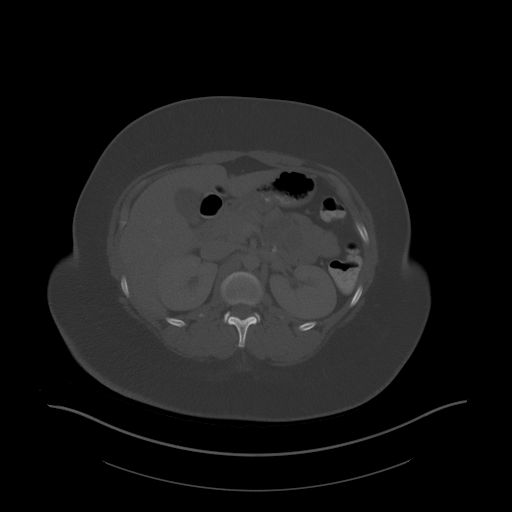
[im 78/99  soft-tissue]
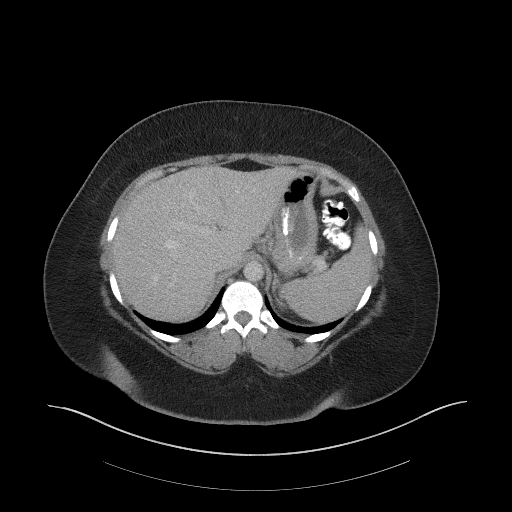
[im 82/99  lung]
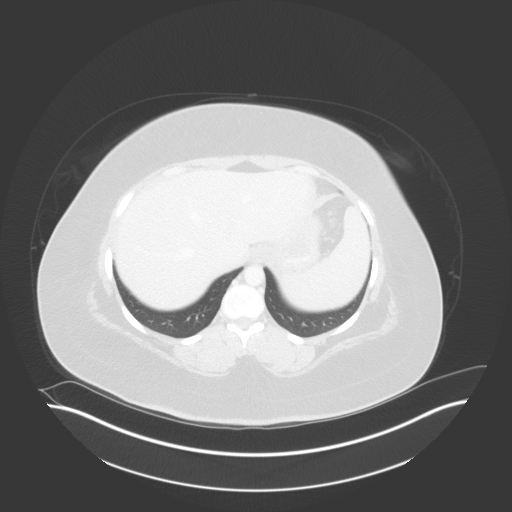
[im 86/99  soft-tissue]
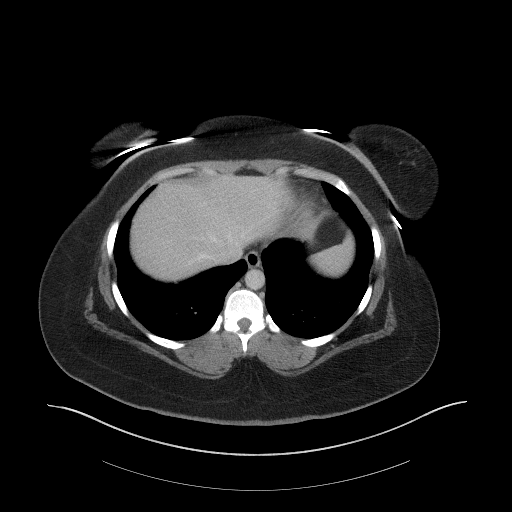
[im 86/99  lung]
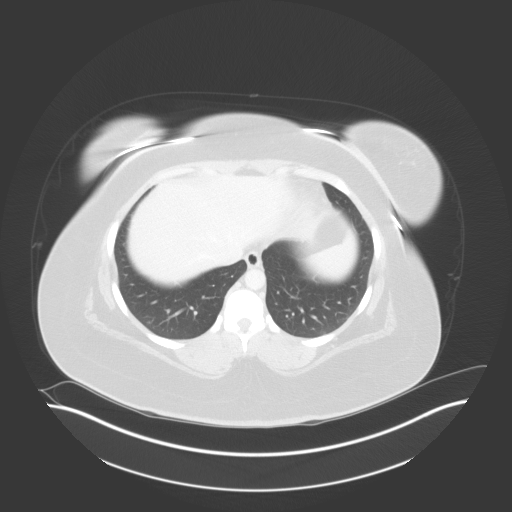
[im 90/99  lung]
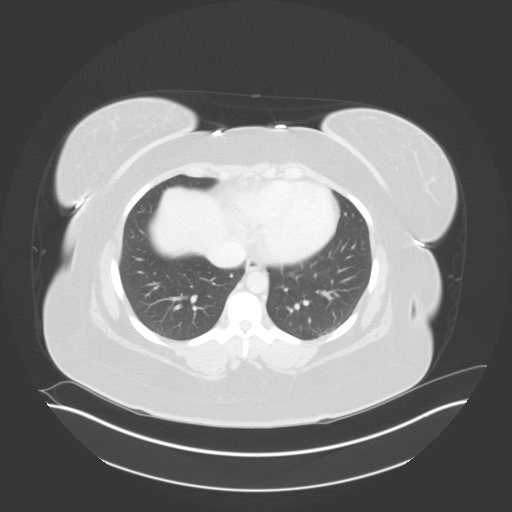
[im 94/99  soft-tissue]
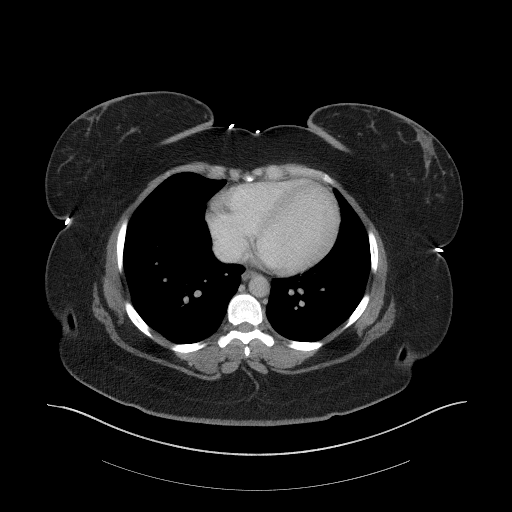
[im 94/99  lung]
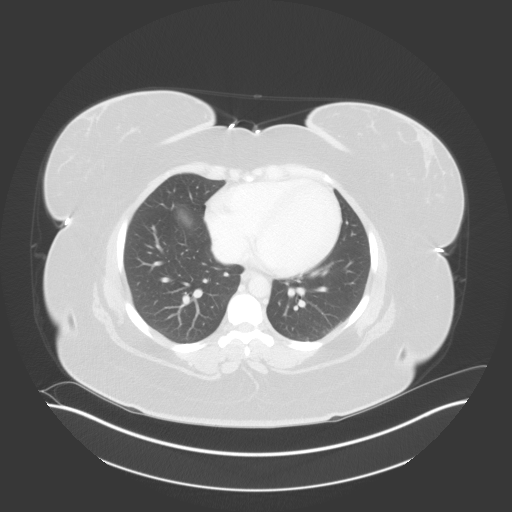

[14 of 32 positions shown; findings below may reference images not displayed]

RADIATION DOSE REDUCTION: This exam was performed according to the
departmental dose-optimization program which includes automated
exposure control, adjustment of the mA and/or kV according to
patient size and/or use of iterative reconstruction technique.

CONTRAST:  100mL 7K280E-SMM IOPAMIDOL (7K280E-SMM) INJECTION 61%
FINDINGS: Lower chest: No acute airspace disease or pleural effusion. The
heart is normal in size.

Hepatobiliary: No focal liver abnormality is seen. No gallstones,
gallbladder wall thickening, or biliary dilatation.

Pancreas: Unremarkable. No pancreatic ductal dilatation or
surrounding inflammatory changes. No visualized pancreatic mass. No
abnormal pancreatic enhancement.

Spleen: Normal in size without focal abnormality.

Adrenals/Urinary Tract: Normal adrenal glands. No hydronephrosis or
perinephric edema. Homogeneous renal enhancement. No renal calculi
or focal renal abnormality. Urinary bladder is partially distended
without wall thickening.

Stomach/Bowel: The stomach is partially distended and unremarkable.
Normal positioning of the duodenum and ligament of Treitz. There is
no small bowel obstruction, inflammation, or evidence of small bowel
mass. Administered enteric contrast reaches the colon. The cecum is
located in the lower right pelvis. Normal appendix visualized.
Moderate stool in the ascending and transverse colon. Small volume
of stool in the more distal colon. There is no colonic wall
thickening, pericolonic edema, or evidence of colonic mass.

Vascular/Lymphatic: Normal caliber abdominal aorta. Patent portal,
splenic, and mesenteric veins. No enlarged lymph nodes in the
abdomen or pelvis.

Reproductive: Recently assessed on ultrasound. The uterus has a
normal CT appearance. The ovaries are symmetric and normal in size.
There is no dominant cystic or solid lesion by CT. No extra ovarian
adnexal mass. Small amount of free fluid in the pelvic cul-de-sac.

Other: Small amount of free fluid in the pelvic cul-de-sac. No free
fluid in the upper abdomen or ascites. No omental thickening. Small
fat containing umbilical hernia. No abdominopelvic fluid collection.

Musculoskeletal: There are no acute or suspicious osseous
abnormalities. No body wall soft tissue abnormalities.
IMPRESSION: 1. Normal CT appearance of the ovaries. No dominant cystic or solid
lesion by CT. No adnexal mass. Pelvic MRI could be considered for
further assessment of the adnexa as clinically indicated.
2. Small amount of free fluid in the pelvic cul-de-sac, with seen on
recent ultrasound.
3. No evidence of pancreatic mass or abnormal enhancement. No
explanation for left flank pain.
4. Small fat containing umbilical hernia.

## 2023-05-23 ENCOUNTER — Other Ambulatory Visit (HOSPITAL_BASED_OUTPATIENT_CLINIC_OR_DEPARTMENT_OTHER): Payer: Self-pay

## 2023-05-26 ENCOUNTER — Other Ambulatory Visit (HOSPITAL_BASED_OUTPATIENT_CLINIC_OR_DEPARTMENT_OTHER): Payer: Self-pay

## 2023-05-26 MED ORDER — NITROFURANTOIN MONOHYD MACRO 100 MG PO CAPS
100.0000 mg | ORAL_CAPSULE | Freq: Two times a day (BID) | ORAL | 0 refills | Status: DC
  Filled 2023-05-26: qty 10, 5d supply, fill #0

## 2023-06-02 ENCOUNTER — Ambulatory Visit: Admitting: Family Medicine

## 2023-06-13 ENCOUNTER — Ambulatory Visit: Admitting: Family Medicine

## 2023-06-21 ENCOUNTER — Ambulatory Visit: Admitting: Family Medicine

## 2023-06-29 ENCOUNTER — Ambulatory Visit: Admitting: Family Medicine

## 2023-06-30 ENCOUNTER — Other Ambulatory Visit (HOSPITAL_BASED_OUTPATIENT_CLINIC_OR_DEPARTMENT_OTHER): Payer: Self-pay

## 2023-06-30 ENCOUNTER — Ambulatory Visit: Admitting: Family Medicine

## 2023-06-30 ENCOUNTER — Encounter: Payer: Self-pay | Admitting: Family Medicine

## 2023-06-30 VITALS — BP 126/82 | HR 65 | Temp 97.8°F | Ht 66.0 in | Wt 224.0 lb

## 2023-06-30 DIAGNOSIS — Z6836 Body mass index (BMI) 36.0-36.9, adult: Secondary | ICD-10-CM

## 2023-06-30 DIAGNOSIS — E66812 Obesity, class 2: Secondary | ICD-10-CM | POA: Diagnosis not present

## 2023-06-30 DIAGNOSIS — E559 Vitamin D deficiency, unspecified: Secondary | ICD-10-CM | POA: Diagnosis not present

## 2023-06-30 DIAGNOSIS — E6609 Other obesity due to excess calories: Secondary | ICD-10-CM

## 2023-06-30 DIAGNOSIS — F439 Reaction to severe stress, unspecified: Secondary | ICD-10-CM

## 2023-06-30 DIAGNOSIS — N62 Hypertrophy of breast: Secondary | ICD-10-CM

## 2023-06-30 MED ORDER — VITAMIN D (ERGOCALCIFEROL) 1.25 MG (50000 UNIT) PO CAPS
50000.0000 [IU] | ORAL_CAPSULE | ORAL | 0 refills | Status: DC
Start: 2023-06-30 — End: 2023-08-09
  Filled 2023-06-30 – 2023-07-15 (×2): qty 5, 35d supply, fill #0

## 2023-06-30 MED ORDER — ZEPBOUND 12.5 MG/0.5ML ~~LOC~~ SOAJ
12.5000 mg | SUBCUTANEOUS | 0 refills | Status: DC
Start: 2023-06-30 — End: 2023-08-09
  Filled 2023-06-30 – 2023-07-15 (×2): qty 2, 28d supply, fill #0
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
## 2023-07-04 ENCOUNTER — Other Ambulatory Visit: Payer: Self-pay

## 2023-07-04 ENCOUNTER — Ambulatory Visit
Admission: EM | Admit: 2023-07-04 | Discharge: 2023-07-04 | Disposition: A | Discharge status: Home / Self Care | Attending: Family Medicine | Admitting: Family Medicine

## 2023-07-04 DIAGNOSIS — T300 Burn of unspecified body region, unspecified degree: Secondary | ICD-10-CM | POA: Diagnosis not present

## 2023-07-04 DIAGNOSIS — M79641 Pain in right hand: Secondary | ICD-10-CM | POA: Diagnosis not present

## 2023-07-04 NOTE — ED Provider Notes (Signed)
 [name]    CSN: [id number] Arrival date & time: 07/04/23  1619      History   Chief Complaint Chief Complaint  Patient presents with   Hand Injury    HPI Tammy [name] is a 23 y.o. female.   Patient states that she works at [employer].  She states today while at work a nitrogen canister opened and she experienced blow back into her palm.  She states it was immediately painful.  She states it remains painful.  She is here for evaluation.  She has not yet gotten approval from her manager to be seen.  She states that she was requested to try and finish her shift, but felt unable.    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   Hypoglycemia 08/27/2021   Vitamin D  deficiency 08/27/2021   GAD (generalized anxiety disorder) 08/27/2021   History of menorrhagia 06/20/2021   Reactive thrombocytosis 06/20/2021   Morbid obesity (HCC) with starting BMI 48 04/28/2021   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   Chronic fatigue 03/25/2021   Anxiety disorder 03/25/2021   Plantar fasciitis of right foot 03/25/2021   Flat foot 03/25/2021   Arthralgia 02/27/2021   BMI 45.0-49.9, adult (HCC) 02/26/2021   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 Authorizing Provider  tirzepatide  (ZEPBOUND ) 12.5 MG/0.5ML Pen Inject 12.5 mg into the skin once a week. 06/30/23   Bowen, Darice BRAVO, DO  Vitamin D , Ergocalciferol , (DRISDOL ) 1.25 MG (50000 UNIT) CAPS capsule Take 1 capsule (50,000 Units total) by mouth every 7 (seven) days. 06/30/23   Bowen, Darice BRAVO, DO    Family History Family History  Problem Relation Age of Onset   Hypertension Mother    Obesity Mother    Breast cancer Maternal Grandmother 22 - 74   Miscarriages / [place] Other    Cancer Other     Social History Social History   Tobacco Use   Smoking status: Never   Smokeless tobacco: Never  Vaping Use   Vaping status: Never Used  Substance Use Topics  Alcohol use: Never   Drug use: Never     Allergies   Doxycycline    Review of Systems Review of Systems See HPI  Physical Exam Triage Vital Signs ED Triage Vitals  Encounter Vitals Group     BP 07/04/23 1627 122/85     Girls Systolic BP Percentile --      Girls Diastolic BP Percentile --      Boys Systolic BP Percentile --      Boys Diastolic BP Percentile --      Pulse Rate 07/04/23 1627 100     Resp 07/04/23 1627 16     Temp 07/04/23 1627 98.3 F (36.8 C)     Temp src --      SpO2 07/04/23 1627 98 %     Weight --      Height --      Head Circumference --      Peak Flow --      Pain Score 07/04/23 1629 7     Pain Loc --      Pain Education --      Exclude from Growth Chart --    No data found.  Updated Vital Signs BP 122/85   Pulse 100   Temp 98.3 F (36.8 C)   Resp 16   SpO2 98%        Physical Exam Constitutional:      General: She is not in acute distress.    Appearance: She is well-developed. She is obese.  HENT:     Head: Normocephalic and atraumatic.   Eyes:     Conjunctiva/sclera: Conjunctivae normal.     Pupils: Pupils are equal, round, and reactive to light.    Cardiovascular:     Rate and Rhythm: Normal rate.  Pulmonary:     Effort: Pulmonary effort is normal. No respiratory distress.  Abdominal:     General: There is no distension.     Palpations: Abdomen is soft.   Musculoskeletal:        General: Normal range of motion.     Cervical back: Normal range of motion.   Skin:    General: Skin is warm and dry.     Findings: No bruising or erythema.     Comments: Patient had endorses the skin of her right palm is painful.  It appears normal.  Sensation is normal to touch.   Neurological:     Mental Status: She is alert.      UC Treatments / Results  Labs (all labs ordered are listed, but only abnormal results are displayed) Labs Reviewed - No data to display  EKG   Radiology No results found.  Procedures Procedures (including critical care time)  Medications Ordered in UC Medications - No data to display  Initial Impression / Assessment and Plan / UC Course  I have reviewed the triage vital signs and the nursing notes.  Pertinent labs & imaging results that were available during my care of the patient were reviewed by me and considered in my medical decision making (see chart for details).     I explained to the patient that sometimes extreme cold can be painful but not do any damage.  Will going to put a burn dressing on the side of caution and recheck it in 48 hours. Last tetanus was in 2013.  Patient chooses not to have a booster today.  There is no open skin Final Clinical Impressions(s) / UC Diagnoses   Final diagnoses:  Thermal injury  Right hand pain     Discharge Instructions      Change dressing once a  day Limit use of hand while wrapped.  Protect dressing from water, soiled Take Tylenol  or ibuprofen  as needed for pain Return for hand check in 2 days-either here or the [facility] medical office downstairs     ED Prescriptions   None    PDMP not reviewed this encounter.   Maranda Jamee Jacob, MD 07/04/23 [phone]

## 2023-07-04 NOTE — Discharge Instructions (Signed)
 Change dressing once a day Limit use of hand while wrapped.  Protect dressing from water, soiled Take Tylenol  or ibuprofen  as needed for pain Return for hand check in 2 days-either here or the [facility]. medical office downstairs

## 2023-07-04 NOTE — ED Triage Notes (Addendum)
 Nitrogen cannister blew back on patient's left hand. Has c/o pain.

## 2023-07-12 ENCOUNTER — Other Ambulatory Visit (HOSPITAL_BASED_OUTPATIENT_CLINIC_OR_DEPARTMENT_OTHER): Payer: Self-pay

## 2023-07-15 ENCOUNTER — Ambulatory Visit
Admission: EM | Admit: 2023-07-15 | Discharge: 2023-07-15 | Disposition: A | Discharge status: Home / Self Care | Attending: Family Medicine | Admitting: Family Medicine

## 2023-07-15 ENCOUNTER — Other Ambulatory Visit (HOSPITAL_BASED_OUTPATIENT_CLINIC_OR_DEPARTMENT_OTHER): Payer: Self-pay

## 2023-07-15 DIAGNOSIS — S61012A Laceration without foreign body of left thumb without damage to nail, initial encounter: Secondary | ICD-10-CM | POA: Diagnosis not present

## 2023-07-15 DIAGNOSIS — Z23 Encounter for immunization: Secondary | ICD-10-CM

## 2023-07-15 MED ORDER — CEPHALEXIN 500 MG PO CAPS
500.0000 mg | ORAL_CAPSULE | Freq: Two times a day (BID) | ORAL | 0 refills | Status: AC
  Filled 2023-07-15: qty 10, 5d supply, fill #0

## 2023-07-15 MED ORDER — TETANUS-DIPHTH-ACELL PERTUSSIS 5-2.5-18.5 LF-MCG/0.5 IM SUSY
0.5000 mL | PREFILLED_SYRINGE | Freq: Once | INTRAMUSCULAR | Status: AC
  Administered 2023-07-15: 0.5 mL via INTRAMUSCULAR

## 2023-07-15 NOTE — Discharge Instructions (Addendum)
 Advised patient to take medication as directed with food to completion.  Encouraged increase daily water intake to 64 ounces per day while taking this medication.  Advised if symptoms worsen and/or unresolved please follow-up with your PCP or here for further evaluation.

## 2023-07-15 NOTE — ED Provider Notes (Signed)
 [name]    CSN: [id number] Arrival date & time: 07/15/23  1338      History   Chief Complaint Chief Complaint  Patient presents with   Laceration    HPI Tammy [name] is a 23 y.o. female.   HPI 23 year old female presents with left thumb laceration sustained on Saturday, 07/09/2023.  PMH significant for migraines and anxiety.  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   Hypoglycemia 08/27/2021   Vitamin D  deficiency 08/27/2021   GAD (generalized anxiety disorder) 08/27/2021   History of menorrhagia 06/20/2021   Reactive thrombocytosis 06/20/2021   Morbid obesity (HCC) with starting BMI 48 04/28/2021   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   Chronic fatigue 03/25/2021   Anxiety disorder 03/25/2021   Plantar fasciitis of right foot 03/25/2021   Flat foot 03/25/2021   Arthralgia 02/27/2021   BMI 45.0-49.9, adult (HCC) 02/26/2021   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 Authorizing Provider  cephALEXin  (KEFLEX ) 500 MG capsule Take 1 capsule (500 mg total) by mouth 2 (two) times daily for 5 days. 07/15/23 07/20/23 Yes Jalicia Roszak, FNP  tirzepatide  (ZEPBOUND ) 12.5 MG/0.5ML Pen Inject 12.5 mg into the skin once a week. 06/30/23   Bowen, Darice BRAVO, DO  Vitamin D , Ergocalciferol , (DRISDOL ) 1.25 MG (50000 UNIT) CAPS capsule Take 1 capsule (50,000 Units total) by mouth every 7 (seven) days. 06/30/23   Bowen, Darice BRAVO, DO    Family History Family History  Problem Relation Age of Onset   Hypertension Mother    Obesity Mother    Breast cancer Maternal Grandmother 4 - 47   Miscarriages / [place] Other    Cancer Other     Social History Social History   Tobacco Use   Smoking status: Never   Smokeless tobacco: Never  Vaping Use   Vaping status: Never Used  Substance Use Topics   Alcohol use: Never   Drug use: Never     Allergies   Doxycycline    Review of Systems Review of Systems  Skin:  Positive for rash  and wound.     Physical Exam Triage Vital Signs ED Triage Vitals  Encounter Vitals Group     BP      Girls Systolic BP Percentile      Girls Diastolic BP Percentile      Boys Systolic BP Percentile      Boys Diastolic BP Percentile      Pulse      Resp      Temp      Temp src      SpO2      Weight      Height      Head Circumference      Peak Flow      Pain Score      Pain Loc      Pain Education      Exclude from Growth Chart    No data found.  Updated Vital Signs BP 129/88   Pulse 89   Temp 97.9 F (36.6 C)   Resp 19   LMP 07/09/2023   SpO2 98%    Physical Exam Vitals and nursing note reviewed.  Constitutional:      Appearance: Normal appearance. She is normal weight.  HENT:     Head: Normocephalic and atraumatic.     Mouth/Throat:     Mouth: Mucous  membranes are moist.     Pharynx: Oropharynx is clear.  Eyes:     Extraocular Movements: Extraocular movements intact.     Conjunctiva/sclera: Conjunctivae normal.     Pupils: Pupils are equal, round, and reactive to light.  Cardiovascular:     Rate and Rhythm: Normal rate and regular rhythm.     Pulses: Normal pulses.     Heart sounds: Normal heart sounds.  Pulmonary:     Effort: Pulmonary effort is normal.     Breath sounds: Normal breath sounds. No wheezing, rhonchi or rales.  Musculoskeletal:        General: Normal range of motion.     Cervical back: Normal range of motion and neck supple.  Skin:    General: Skin is warm and dry.     Comments: Left thumb (volar aspect at proximal phalanx): tiny 2 mm x 0.5 cm mildly erythematous skin avulsion-please see image below  Neurological:     General: No focal deficit present.     Mental Status: She is alert and oriented to person, place, and time. Mental status is at baseline.  Psychiatric:        Mood and Affect: Mood normal.        Behavior: Behavior normal.      UC Treatments / Results  Labs (all labs ordered are listed, but only abnormal results are displayed) Labs Reviewed - No data to display  EKG   Radiology No results found.  Procedures Procedures (including critical care time)  Medications Ordered in UC Medications  Tdap (BOOSTRIX ) injection 0.5 mL (0.5 mLs Intramuscular Given 07/15/23 1442)    Initial Impression / Assessment and Plan / UC Course  I have reviewed the triage vital signs and the nursing notes.  Pertinent labs & imaging results that were available during my care of the patient were reviewed by me and considered in my medical decision making (see chart for details).     MDM: 1.  Laceration of left thumb without foreign body, without damage to nail, initial exam-Tdap provided in clinic per patient request, Rx'd Keflex  500 mg capsule: Take 1 capsule twice daily x 5 days. Advised patient  to take  medication as directed with food to completion.  Encouraged increase daily water intake to 64 ounces per day while taking this medication.  Advised if symptoms worsen and/or unresolved please follow-up with your PCP or here for further evaluation.  Final Clinical Impressions(s) / UC Diagnoses   Final diagnoses:  Laceration of left thumb without foreign body without damage to nail, initial encounter     Discharge Instructions      Advised patient to take medication as directed with food to completion.  Encouraged increase daily water intake to 64 ounces per day while taking this medication.  Advised if symptoms worsen and/or unresolved please follow-up with your PCP or here for further evaluation.     ED Prescriptions     Medication Sig Dispense Auth. Provider   cephALEXin  (KEFLEX ) 500 MG capsule Take 1 capsule (500 mg total) by mouth 2 (two) times daily for 5 days. 10 capsule Aesha Agrawal, FNP      PDMP not reviewed this encounter.   Teddy Sharper, FNP 07/15/23 1451

## 2023-07-15 NOTE — ED Triage Notes (Signed)
 Pt presents to uc with co left thumb lac on 7/5 at work with a box cutter. Pt thought it would be fine but is not worried she needs TDAP. Pain in thumb. Wound approximated no bleeding noting.

## 2023-08-04 ENCOUNTER — Ambulatory Visit: Admitting: Family Medicine

## 2023-08-09 ENCOUNTER — Other Ambulatory Visit (HOSPITAL_BASED_OUTPATIENT_CLINIC_OR_DEPARTMENT_OTHER): Payer: Self-pay

## 2023-08-09 ENCOUNTER — Ambulatory Visit: Admitting: Family Medicine

## 2023-08-09 ENCOUNTER — Encounter: Payer: Self-pay | Admitting: Family Medicine

## 2023-08-09 VITALS — BP 114/77 | HR 69 | Temp 98.0°F | Ht 66.0 in | Wt 218.0 lb

## 2023-08-09 DIAGNOSIS — F419 Anxiety disorder, unspecified: Secondary | ICD-10-CM

## 2023-08-09 DIAGNOSIS — E559 Vitamin D deficiency, unspecified: Secondary | ICD-10-CM | POA: Diagnosis not present

## 2023-08-09 DIAGNOSIS — Z6835 Body mass index (BMI) 35.0-35.9, adult: Secondary | ICD-10-CM

## 2023-08-09 DIAGNOSIS — E66812 Obesity, class 2: Secondary | ICD-10-CM

## 2023-08-09 MED ORDER — VITAMIN D (ERGOCALCIFEROL) 1.25 MG (50000 UNIT) PO CAPS
50000.0000 [IU] | ORAL_CAPSULE | ORAL | 0 refills | Status: DC
  Filled 2023-08-09 – 2023-08-10 (×2): qty 5, 35d supply, fill #0

## 2023-08-09 MED ORDER — ZEPBOUND 12.5 MG/0.5ML ~~LOC~~ SOAJ
12.5000 mg | SUBCUTANEOUS | 1 refills | Status: DC
  Filled 2023-08-09: qty 2, 28d supply, fill #0

## 2023-08-09 NOTE — Progress Notes (Signed)
 Office: [phone]  /  Fax: [phone]  WEIGHT SUMMARY AND BIOMETRICS  Starting Date: 08/13/21  Starting Weight: 298lb   Weight Lost Since Last Visit: 6lb   Vitals Temp: 98 F (36.7 C) BP: 114/77 Pulse Rate: 69 SpO2: 100 %   Body Composition  Body Fat %: 45.1 % Fat Mass (lbs): 98.6 lbs Muscle Mass (lbs): 114 lbs Total Body Water (lbs): 96.6 lbs Visceral Fat Rating : 9    HPI  Chief Complaint: OBESITY  Symphany is here to discuss her progress with her obesity treatment plan. She is on the the Category 3 Plan and states she is following her eating plan approximately 0 % of the time. She states she is exercising 0 minutes 0 times per week.  Interval History:  Since last office visit she is down 6 lb This gives her a net weight loss of 71 pounds in the past 23 months of medically supervised weight management This is a 23.8% total body weight loss She is working on remembering to take her Zepbound  12.5 mg weekly She has completed training as a Production designer, theatre/television/film for [employer] Her schedule will be early morning shift She is meeting with a new Engineer, petroleum to discuss breast reduction surgery She has been standing for work with some movement She plans to get back in with a trainer  Pharmacotherapy: Zepbound  12.5 mg weekly  PHYSICAL EXAM:  Blood pressure 114/77, pulse 69, temperature 98 F (36.7 C), height 5' 6 (1.676 m), weight 218 lb (98.9 kg), last menstrual period 07/09/2023, SpO2 100%. Body mass index is 35.19 kg/m.  General: She is overweight, cooperative, alert, well developed, and in no acute distress. PSYCH: Has normal mood, affect and thought process.   Lungs: Normal breathing effort, no conversational dyspnea.  ASSESSMENT AND PLAN  TREATMENT PLAN FOR OBESITY:  Recommended Dietary Goals  Jissell is currently in the action stage of change. As such, her goal is to continue weight management plan. She has agreed to the Category 3 Plan and keeping a food  journal and adhering to recommended goals of 1600 calories and 100+ grams of protein.  Behavioral Intervention  We discussed the following Behavioral Modification Strategies today: increasing lean protein intake to established goals, increasing fiber rich foods, increasing water intake , work on meal planning and preparation, keeping healthy foods at home, identifying sources and decreasing liquid calories, practice mindfulness eating and understand the difference between hunger signals and cravings, work on managing stress, creating time for self-care and relaxation, and continue to work on maintaining a reduced calorie state, getting the recommended amount of protein, incorporating whole foods, making healthy choices, staying well hydrated and practicing mindfulness when eating..  Additional resources provided today: NA  Recommended Physical Activity Goals  Lindzey has been advised to work up to 150 minutes of moderate intensity aerobic activity a week and strengthening exercises 2-3 times per week for cardiovascular health, weight loss maintenance and preservation of muscle mass.   She has agreed to [employer] strengthening exercises with a goal of 2-3 sessions a week   Pharmacotherapy changes for the treatment of obesity: None  ASSOCIATED CONDITIONS ADDRESSED TODAY  Class 2 obesity due to excess calories with body mass index (BMI) of 35.0 to 35.9 in adult, unspecified whether serious comorbidity present -     Zepbound ; Inject 12.5 mg into the skin once a week.  Dispense: 2 mL; Refill: 1 She has improved satiety on Zepbound  12.5 mg once weekly injection without meal skipping or GI upset.  She declined need to increase her dose.  Continue to work on eating schedule, meal planning, healthy food choices with a focus on lean protein and fiber with meals.  Eliminate excess added sugar.  She is working as a Production designer, theatre/television/film at [employer].  We discussed the importance of avoiding pastry items and high sugar coffee  drinks.  Plan to resume weight training workouts at the gym with a trainer 2 to 3 days a week on a consistent basis.  Her barriers to progress over the past month have been related to her training schedule and work hours.  Schedule consistency has now improved  Vitamin D  deficiency Last vitamin D  Lab Results  Component Value Date   VD25OH 19.5 (L) 03/21/2023   Vitamin D  has been low.  She has some nonadherence taking vitamin D  50,000 IU once weekly.  She has complaints of fatigue.  Resume and refill vitamin D  50,000 IU once weekly.  Recheck level in 2 months -     Vitamin D  (Ergocalciferol ); Take 1 capsule (50,000 Units total) by mouth every 7 (seven) days.  Dispense: 5 capsule; Refill: 0  Anxiety disorder, unspecified type Currently off medication for anxiety.  Stress levels have been higher but she is overall happy with her new job and has a fairly good support system.  She has a much better handle on stress eating and over eating with use of Zepbound .  Begin working on mindful eating, stress reduction and improving sleep at night.  Commended her on her progress over the past almost 2 years of medically supervised weight management.     She was informed of the importance of frequent follow up visits to maximize her success with intensive lifestyle modifications for her multiple health conditions.   ATTESTASTION STATEMENTS:  Reviewed by clinician on day of visit: allergies, medications, problem list, medical history, surgical history, family history, social history, and previous encounter notes pertinent to obesity diagnosis.   I have personally spent 30 minutes total time today in preparation, patient care, nutritional counseling and education,  and documentation for this visit, including the following: review of most recent clinical lab tests, prescribing medications/ refilling medications, reviewing medical assistant documentation, review and interpretation of bioimpedence results.      [name], D.O. DABFM, DABOM [facility] [address] [place], [place] [address] [phone]

## 2023-08-09 NOTE — Patient Instructions (Signed)
 Women's Multivitamin with iron  daily RX vitamin D  weekly Zepbound  12.5 mg weekly  Trainer goal: 2 days/wk to start in October  Hydrate well with water Fruits/ veggies/ lean protein with meals and snacks  Go to bed earlier

## 2023-08-10 ENCOUNTER — Other Ambulatory Visit (HOSPITAL_BASED_OUTPATIENT_CLINIC_OR_DEPARTMENT_OTHER): Payer: Self-pay

## 2023-08-15 ENCOUNTER — Other Ambulatory Visit (HOSPITAL_BASED_OUTPATIENT_CLINIC_OR_DEPARTMENT_OTHER): Payer: Self-pay

## 2023-08-23 ENCOUNTER — Other Ambulatory Visit (HOSPITAL_BASED_OUTPATIENT_CLINIC_OR_DEPARTMENT_OTHER): Payer: Self-pay

## 2023-08-23 ENCOUNTER — Ambulatory Visit
Admission: EM | Admit: 2023-08-23 | Discharge: 2023-08-23 | Disposition: A | Discharge status: Home / Self Care | Attending: Family Medicine | Admitting: Family Medicine

## 2023-08-23 DIAGNOSIS — J0100 Acute maxillary sinusitis, unspecified: Secondary | ICD-10-CM

## 2023-08-23 DIAGNOSIS — R0981 Nasal congestion: Secondary | ICD-10-CM | POA: Diagnosis not present

## 2023-08-23 MED ORDER — PREDNISONE 20 MG PO TABS
60.0000 mg | ORAL_TABLET | Freq: Every day | ORAL | 0 refills | Status: AC
  Filled 2023-08-23: qty 15, 5d supply, fill #0

## 2023-08-23 MED ORDER — AZITHROMYCIN 250 MG PO TABS
250.0000 mg | ORAL_TABLET | Freq: Every day | ORAL | 0 refills | Status: DC
  Filled 2023-08-23: qty 6, 5d supply, fill #0

## 2023-08-23 NOTE — ED Triage Notes (Signed)
 Pt c/o sore throat since Fri. Cough started Sat followed with wheezing on Sun. Denies fever.

## 2023-08-23 NOTE — ED Provider Notes (Signed)
 [name]    CSN: [id number] Arrival date & time: 08/23/23  9188      History   Chief Complaint Chief Complaint  Patient presents with   Sore Throat   Cough   Wheezing    HPI Tammy [name] is a 23 y.o. female.   HPI 23 year old female presents with sore throat, cough, and wheezing for 4 days.  PMH significant for anemia, anxiety disorder, and migraines  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   Hypoglycemia 08/27/2021   Vitamin D  deficiency 08/27/2021   GAD (generalized anxiety disorder) 08/27/2021   History of menorrhagia 06/20/2021   Reactive thrombocytosis 06/20/2021   Morbid obesity (HCC) with starting BMI 48 04/28/2021   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   Chronic fatigue 03/25/2021   Anxiety disorder 03/25/2021   Plantar fasciitis of right foot 03/25/2021   Flat foot 03/25/2021   Arthralgia 02/27/2021   BMI 45.0-49.9, adult (HCC) 02/26/2021   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 Authorizing Provider  azithromycin  (ZITHROMAX ) 250 MG tablet Take 1 tablet (250 mg total) by mouth daily. Take first 2 tablets together, then 1 every day until finished. 08/23/23  Yes Teddy Sharper, FNP  predniSONE  (DELTASONE ) 20 MG tablet Take 3 tablets (60 mg total) by mouth daily for 5 days. 08/23/23 08/28/23 Yes Teddy Sharper, FNP  tirzepatide  (ZEPBOUND ) 12.5 MG/0.5ML Pen Inject 12.5 mg into the skin once a week. 08/09/23   Bowen, Darice BRAVO, DO  Vitamin D , Ergocalciferol , (DRISDOL ) 1.25 MG (50000 UNIT) CAPS capsule Take 1 capsule (50,000 Units total) by mouth every 7 (seven) days. 08/09/23   Bowen, Darice BRAVO, DO    Family History Family History  Problem Relation Age of Onset   Hypertension Mother    Obesity Mother    Breast cancer Maternal Grandmother 49 - 1   Miscarriages / [place] Other    Cancer Other     Social History Social History   Tobacco Use   Smoking status: Never   Smokeless tobacco: Never  Vaping Use   Vaping  status: Never Used  Substance Use Topics   Alcohol use: Never   Drug use: Never     Allergies   Doxycycline    Review of Systems Review of Systems  HENT:  Positive for sore throat.   Respiratory:  Positive for cough and wheezing.      Physical Exam Triage Vital Signs ED Triage Vitals  Encounter Vitals Group     BP 08/23/23 0832 (!) 128/97     Girls Systolic BP Percentile --      Girls Diastolic BP Percentile --      Boys Systolic BP Percentile --      Boys Diastolic BP Percentile --      Pulse Rate 08/23/23 0832 100     Resp 08/23/23 0832 17     Temp 08/23/23 0832 98.2 F (36.8 C)     Temp Source 08/23/23 0832 Oral     SpO2 08/23/23 0832 98 %     Weight --      Height --      Head Circumference --      Peak Flow --      Pain Score 08/23/23 0834 7     Pain Loc  --      Pain Education --      Exclude from Growth Chart --    No data found.  Updated Vital Signs BP (!) 128/97 (BP Location: Right Wrist)   Pulse 100   Temp 98.2 F (36.8 C) (Oral)   Resp 17   LMP 08/14/2023 (Approximate)   SpO2 98%   Visual Acuity Right Eye Distance:   Left Eye Distance:   Bilateral Distance:    Right Eye Near:   Left Eye Near:    Bilateral Near:     Physical Exam Vitals and nursing note reviewed.  Constitutional:      Appearance: Normal appearance. She is well-developed. She is obese. She is ill-appearing.  HENT:     Head: Normocephalic and atraumatic.     Right Ear: Tympanic membrane and external ear normal.     Left Ear: Tympanic membrane and external ear normal.     Ears:     Comments: Significant eustachian tube dysfunction noted bilaterally    Nose:     Comments: Turbinates are erythematous/edematous    Mouth/Throat:     Mouth: Mucous membranes are moist.     Pharynx: Oropharynx is clear. Uvula midline.  Eyes:     Extraocular Movements: Extraocular movements intact.     Pupils: Pupils are equal, round, and reactive to light.  Cardiovascular:     Rate and Rhythm: Normal rate and regular rhythm.     Heart sounds: Normal heart sounds.  Pulmonary:     Effort: Pulmonary effort is normal.     Breath sounds: Normal breath sounds. No wheezing, rhonchi or rales.  Musculoskeletal:        General: Normal range of motion.  Skin:    General: Skin is warm and dry.  Neurological:     General: No focal deficit present.     Mental Status: She is alert and oriented to person, place, and time.  Psychiatric:        Mood and Affect: Mood normal.        Behavior: Behavior normal.      UC Treatments / Results  Labs (all labs ordered are listed, but only abnormal results are displayed) Labs Reviewed - No data to display  EKG   Radiology No results found.  Procedures Procedures (including critical care time)  Medications Ordered in  UC Medications - No data to display  Initial Impression / Assessment and Plan / UC Course  I have reviewed the triage vital signs and the nursing notes.  Pertinent labs & imaging results that were available during my care of the patient were reviewed by me and considered in my medical decision making (see chart for details).     MDM: 1.  Subacute maxillary sinusitis-Rx'd Zithromax  (500 mg day 1, then 250 mg day 2-5); 2.  Congestion of nasal sinus-Rx'd prednisone  20 mg tablet: Take 3 tablets p.o. daily x 5 days. Advised patient to take medications as directed with food to completion.  Encouraged to increase daily water intake to 64 ounces per day while taking these medications.  Advised if symptoms worsen and/or unresolved please follow-up with your PCP or here for further evaluation.  Patient discharged home, hemodynamically stable.  Work note provided to patient prior to discharge per request. Final Clinical Impressions(s) / UC Diagnoses   Final diagnoses:  Subacute maxillary sinusitis  Congestion of nasal sinus     Discharge Instructions      Advised patient to take medications as directed with food to completion.  Encouraged to increase daily water intake to 64 ounces per day while taking these medications.  Advised if symptoms worsen and/or unresolved please follow-up with your PCP or here for further evaluation.     ED Prescriptions     Medication Sig Dispense Auth. Provider   azithromycin  (ZITHROMAX ) 250 MG tablet Take 1 tablet (250 mg total) by mouth daily. Take first 2 tablets together, then 1 every day until finished. 6 tablet Aleksis Jiggetts, FNP   predniSONE  (DELTASONE ) 20 MG tablet Take 3 tablets (60 mg total) by mouth daily for 5 days. 15 tablet Haruka Kowaleski, FNP      PDMP not reviewed this encounter.   Teddy Sharper, FNP 08/23/23 [phone]

## 2023-08-23 NOTE — Discharge Instructions (Addendum)
 Advised patient to take medications as directed with food to completion.  Encouraged to increase daily water intake to 64 ounces per day while taking these medications.  Advised if symptoms worsen and/or unresolved please follow-up with your PCP or here for further evaluation.

## 2023-08-29 ENCOUNTER — Other Ambulatory Visit (HOSPITAL_BASED_OUTPATIENT_CLINIC_OR_DEPARTMENT_OTHER): Payer: Self-pay

## 2023-08-29 MED ORDER — CELECOXIB 200 MG PO CAPS
200.0000 mg | ORAL_CAPSULE | Freq: Every day | ORAL | 1 refills | Status: AC
  Filled 2023-08-29: qty 30, 30d supply, fill #0

## 2023-09-08 ENCOUNTER — Other Ambulatory Visit (HOSPITAL_BASED_OUTPATIENT_CLINIC_OR_DEPARTMENT_OTHER): Payer: Self-pay

## 2023-09-27 ENCOUNTER — Ambulatory Visit: Admitting: Family Medicine

## 2023-09-29 ENCOUNTER — Other Ambulatory Visit: Payer: Self-pay

## 2023-09-29 ENCOUNTER — Other Ambulatory Visit (HOSPITAL_BASED_OUTPATIENT_CLINIC_OR_DEPARTMENT_OTHER): Payer: Self-pay

## 2023-09-29 MED ORDER — CLINDAMYCIN PHOS (TWICE-DAILY) 1 % EX GEL
CUTANEOUS | 3 refills | Status: AC
  Filled 2023-09-29: qty 60, 30d supply, fill #0

## 2023-09-29 MED ORDER — BETAMETHASONE DIPROPIONATE 0.05 % EX CREA
TOPICAL_CREAM | Freq: Every day | CUTANEOUS | 1 refills | Status: AC
  Filled 2023-09-29: qty 45, 30d supply, fill #0

## 2023-10-03 ENCOUNTER — Encounter: Payer: Self-pay | Admitting: Family Medicine

## 2023-10-03 ENCOUNTER — Ambulatory Visit: Admitting: Family Medicine

## 2023-10-03 ENCOUNTER — Other Ambulatory Visit (HOSPITAL_BASED_OUTPATIENT_CLINIC_OR_DEPARTMENT_OTHER): Payer: Self-pay

## 2023-10-03 VITALS — BP 102/71 | HR 77 | Temp 97.9°F | Ht 66.0 in | Wt 223.0 lb

## 2023-10-03 DIAGNOSIS — E559 Vitamin D deficiency, unspecified: Secondary | ICD-10-CM | POA: Diagnosis not present

## 2023-10-03 DIAGNOSIS — Z6835 Body mass index (BMI) 35.0-35.9, adult: Secondary | ICD-10-CM

## 2023-10-03 DIAGNOSIS — E6609 Other obesity due to excess calories: Secondary | ICD-10-CM

## 2023-10-03 DIAGNOSIS — E639 Nutritional deficiency, unspecified: Secondary | ICD-10-CM

## 2023-10-03 DIAGNOSIS — F439 Reaction to severe stress, unspecified: Secondary | ICD-10-CM

## 2023-10-03 DIAGNOSIS — E66812 Obesity, class 2: Secondary | ICD-10-CM | POA: Diagnosis not present

## 2023-10-03 DIAGNOSIS — Z9189 Other specified personal risk factors, not elsewhere classified: Secondary | ICD-10-CM

## 2023-10-03 MED ORDER — ZEPBOUND 10 MG/0.5ML ~~LOC~~ SOAJ
10.0000 mg | SUBCUTANEOUS | 0 refills | Status: DC
Start: 2023-10-03 — End: 2023-11-01
  Filled 2023-10-03: qty 2, 28d supply, fill #0

## 2023-10-03 MED ORDER — VITAMIN D (ERGOCALCIFEROL) 1.25 MG (50000 UNIT) PO CAPS
50000.0000 [IU] | ORAL_CAPSULE | ORAL | 0 refills | Status: DC
  Filled 2023-10-03: qty 5, 35d supply, fill #0

## 2023-10-03 NOTE — Progress Notes (Signed)
 Office: [phone]  /  Fax: [phone]  WEIGHT SUMMARY AND BIOMETRICS  Starting Date: 08/13/21  Starting Weight: 298lb   Weight Lost Since Last Visit: 0lb   Vitals Temp: 97.9 F (36.6 C) BP: 102/71 Pulse Rate: 77 SpO2: 100 %   Body Composition  Body Fat %: 45.6 % Fat Mass (lbs): 102 lbs Muscle Mass (lbs): 115.6 lbs Total Body Water (lbs): 95.6 lbs Visceral Fat Rating : 9    HPI  Chief Complaint: OBESITY  Tammy [name] is here to discuss her progress with her obesity treatment plan. She is on the the Category 3 Plan and states she is following her eating plan approximately 0 % of the time. She states she is exercising 0 minutes 0 times per week.  Interval History:  Since last office visit she is up 5 lb This gives her a net weight loss of 66 lb in the past 25 mos of medically supervised weight management This is a 22% TBW loss She did hold her Zepbound  12.5 mg x 3 weeks due to not sleeping well and making bad food choices She feels ready to get back on track She had a lot of work related stressors and was lacking sleep She has been indulging more on sweets and treats She is moving next month with her friend and will have an apartment gym She is planning to restart real estate school  Pharmacotherapy: Zepbound , off x 3 weeks  PHYSICAL EXAM:  Blood pressure 102/71, pulse 77, temperature 97.9 F (36.6 C), height 5' 6 (1.676 m), weight 223 lb (101.2 kg), SpO2 100%. Body mass index is 35.99 kg/m.  General: She is overweight, cooperative, alert, well developed, and in no acute distress. PSYCH: Has normal mood, affect and thought process.   Lungs: Normal breathing effort, no conversational dyspnea.   ASSESSMENT AND PLAN  TREATMENT PLAN FOR OBESITY:  Recommended Dietary Goals  Tammy [name] is currently in the action stage of change. As such, her goal is to continue weight management plan. She has agreed to the Category 3 Plan and keeping a food journal and adhering  to recommended goals of 1600 calories and 100 g of  protein. Reviewed dietary change goals on AVS together  Behavioral Intervention  We discussed the following Behavioral Modification Strategies today: increasing lean protein intake to established goals, increasing fiber rich foods, increasing water intake , work on meal planning and preparation, work on tracking and journaling calories using tracking application, keeping healthy foods at home, avoiding temptations and identifying enticing environmental cues, planning for success, and better snacking choices.  Additional resources provided today: NA  Recommended Physical Activity Goals  Tammy [name] has been advised to work up to 150 minutes of moderate intensity aerobic activity a week and strengthening exercises 2-3 times per week for cardiovascular health, weight loss maintenance and preservation of muscle mass.   She has agreed to Increase and monitor steps for a goal of 10,000 per day  Pharmacotherapy changes for the treatment of obesity: resume Zepbound , reducing to 10 mg weekly  ASSOCIATED CONDITIONS ADDRESSED TODAY  Has poorly balanced diet Worsened. Admits to poor planning, consuming higher saturated fat/ carb/ sugar intake worsened by sleep deprivation work schedule and being around poor food choices. She agrees to working on meal planning/ meal prep, reducing junk food intake and improving sleep time.  Vitamin D  deficiency Last vitamin D  Lab Results  Component Value Date   VD25OH 19.5 (L) 03/21/2023  Doing better with compliance on RX vitamin D  weekly Recheck  lab next visit -     Vitamin D  (Ergocalciferol ); Take 1 capsule (50,000 Units total) by mouth every 7 (seven) days.  Dispense: 5 capsule; Refill: 0  Class 2 obesity due to excess calories with body mass index (BMI) of 35.0 to 35.9 in adult, unspecified whether serious comorbidity present -     Zepbound ; Inject 10 mg into the skin once a week.  Dispense: 2 mL; Refill:  0 With her lack of injections x 3 weeks, will reduce dose from 12.5 --> 10 mg weekly  Situational stress Worsened She will be moving in with a friend in Nov who is more supportive and will have access to a gym.  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  This has hindered her weight loss.  She has a plan to start real estate school in the next year and has a good support system.     She was informed of the importance of frequent follow up visits to maximize her success with intensive lifestyle modifications for her multiple health conditions.   ATTESTASTION STATEMENTS:  Reviewed by clinician on day of visit: allergies, medications, problem list, medical history, surgical history, family history, social history, and previous encounter notes pertinent to obesity diagnosis.   I have personally spent 30 minutes total time today in preparation, patient care, nutritional counseling and education,  and documentation for this visit, including the following: review of most recent clinical lab tests, prescribing medications/ refilling medications, reviewing medical assistant documentation, review and interpretation of bioimpedence results.     [name], D.O. DABFM, DABOM [facility] [address] [place], [place] [address] [phone]

## 2023-10-03 NOTE — Patient Instructions (Signed)
 Restart Zepbound  at 10 mg weekly  Work on improving sleep (as much as you can) Remember lean protein with all your meals Keep frozen meals for easy dinners: Healthy Choice Lean Cuisine [employer] Meals  Work on meal prepping lunches and bringing them to work Keep fresh fruit on hand for an easy snack Keep greek yogurts on hand for an easy snack  Plan to: get started at the gym in the next 2 months

## 2023-10-27 ENCOUNTER — Ambulatory Visit: Admitting: Family Medicine

## 2023-11-01 ENCOUNTER — Other Ambulatory Visit (HOSPITAL_BASED_OUTPATIENT_CLINIC_OR_DEPARTMENT_OTHER): Payer: Self-pay

## 2023-11-01 ENCOUNTER — Ambulatory Visit (INDEPENDENT_AMBULATORY_CARE_PROVIDER_SITE_OTHER): Admitting: Family Medicine

## 2023-11-01 ENCOUNTER — Encounter: Payer: Self-pay | Admitting: Family Medicine

## 2023-11-01 VITALS — BP 114/76 | HR 80 | Temp 98.6°F | Ht 66.0 in | Wt 223.0 lb

## 2023-11-01 DIAGNOSIS — E6609 Other obesity due to excess calories: Secondary | ICD-10-CM

## 2023-11-01 DIAGNOSIS — N62 Hypertrophy of breast: Secondary | ICD-10-CM

## 2023-11-01 DIAGNOSIS — F439 Reaction to severe stress, unspecified: Secondary | ICD-10-CM | POA: Diagnosis not present

## 2023-11-01 DIAGNOSIS — Z9189 Other specified personal risk factors, not elsewhere classified: Secondary | ICD-10-CM

## 2023-11-01 DIAGNOSIS — E559 Vitamin D deficiency, unspecified: Secondary | ICD-10-CM | POA: Diagnosis not present

## 2023-11-01 DIAGNOSIS — E66812 Obesity, class 2: Secondary | ICD-10-CM

## 2023-11-01 DIAGNOSIS — Z6835 Body mass index (BMI) 35.0-35.9, adult: Secondary | ICD-10-CM

## 2023-11-01 MED ORDER — ZEPBOUND 12.5 MG/0.5ML ~~LOC~~ SOAJ
12.5000 mg | SUBCUTANEOUS | 0 refills | Status: DC
  Filled 2023-11-01: qty 2, 28d supply, fill #0

## 2023-11-01 NOTE — Progress Notes (Addendum)
 Office: [phone]  /  Fax: [phone]  WEIGHT SUMMARY AND BIOMETRICS  Starting Date: 08/13/21  Starting Weight: 298lb   Weight Lost Since Last Visit: 0lb   Vitals Temp: 98.6 F (37 C) BP: 114/76 Pulse Rate: 80 SpO2: 96 %   Body Composition  Body Fat %: 46.3 % Fat Mass (lbs): 103.6 lbs Muscle Mass (lbs): 114 lbs Visceral Fat Rating : 9     HPI  Chief Complaint: OBESITY  Tammy [name] is here to discuss her progress with her obesity treatment plan. She is on the the Category 3 Plan and states she is following her eating plan approximately 60 % of the time. She states she is exercising 0 minutes 0 times per week.   Interval History:  Since last office visit she is down 0 lb This gives her a net weight loss of 66 lb in 26 mos of medically supervised weight management This is a 22% TBW loss She did restart Zepbound  10 mg weekly She has one more injection remaining She denies nausea or meal skipping She is moving out next week She is still working crazy hours at work which affects meal planning She is trying to not eat meals at work 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 Body mass index is 35.99 kg/m.  General: She is overweight, cooperative, alert, well developed, and in no acute distress. PSYCH: Has normal mood, affect and thought process.   Lungs: Normal breathing effort, no conversational dyspnea.  ASSESSMENT AND PLAN  TREATMENT PLAN FOR OBESITY:  Recommended Dietary Goals  Tammy [name] is currently in the action stage of  change. As such, her goal is to continue weight management plan. She has agreed to practicing portion control and making smarter food choices, such as increasing vegetables and decreasing simple carbohydrates.  Behavioral Intervention  We discussed the following Behavioral Modification Strategies today: increasing lean protein intake to established goals, increasing fiber rich foods, increasing water intake , work on meal planning and preparation, keeping healthy foods at home, decreasing eating out or consumption of processed foods, and making healthy choices when eating convenient foods, avoiding temptations and identifying enticing environmental cues, planning for success, and better snacking choices.  Additional resources provided today: NA  Recommended Physical Activity Goals  Tammy [name] has been advised to work up to 150 minutes of moderate intensity aerobic activity a week and strengthening exercises 2-3 times per week for cardiovascular health, weight loss maintenance and preservation of muscle mass.   She has agreed to Think about enjoyable ways to increase daily physical activity and overcoming barriers to exercise and Increase physical activity in their day and reduce sedentary time (increase NEAT).  Pharmacotherapy changes for the treatment of obesity: increase next RX of Zepbound  back to 12.5 mg weekly.  Hold Zepbound  x 2 weeks prior to upcoming breast surgery  ASSOCIATED CONDITIONS ADDRESSED TODAY  Situational stress Unchanged She is moving out next week and feels that her support system will be better away from her family Continues to work alternating shifts and has one more month  of school left This has affected her sleep, mood and meal choices  She agrees to packing protein bars, fruit, protein shakes for work Improve sleep and self care as much as possible Time limitations/ stress/ sleep should improve over the next month  Class 2 obesity due to excess calories with body  mass index (BMI) of 35.0 to 35.9 in adult, unspecified whether serious comorbidity present -     Zepbound ; Inject 12.5 mg into the skin once a week.  Dispense: 2 mL; Refill: 0  Patient was counseled on the importance of maintaining healthy lifestyle habits, including balanced nutrition, regular physical activity, and behavioral modifications, while taking antiobesity medication.  Patient verbalized understanding that medication is an adjunct to, not a replacement for, lifestyle changes and that the long-term success and weight maintenance depend on continued adherence to these strategies.  Macromastia Scheduled for breast reduction surgery with Dr Vila 12/3 She will stop Zepbound  2 weeks prior to upcoming surgery and may resume it once she is eating regular foods  Vitamin D  deficiency Last vitamin D  Lab Results  Component Value Date   VD25OH 19.5 (L) 03/21/2023   She is off of RX vitamin D  Plan to get a level on her in the new year  Has poorly balanced diet Unchanged Her food recall remains poor with fast food, lacking adequate fiber Has poor support at home and alternating work hours Work on planning ahead, grab and go snacks, plan to stock up new house with better foods   She was informed of the importance of frequent follow up visits to maximize her success with intensive lifestyle modifications for her multiple health conditions.   ATTESTASTION STATEMENTS:  Reviewed by clinician on day of visit: allergies, medications, problem list, medical history, surgical history, family history, social history, and previous encounter notes pertinent to obesity diagnosis.   I have personally spent 30 minutes total time today in preparation, patient care, nutritional counseling and education,  and documentation for this visit, including the following: review of most recent clinical lab tests, prescribing medications/ refilling medications, reviewing medical assistant documentation, review and  interpretation of bioimpedence results.     [name], D.O. DABFM, DABOM [facility] [address] [place], [place] [address] [phone]

## 2023-11-01 NOTE — Patient Instructions (Signed)
 Finish out Zepbound  10 mg pen this coming week and then 12.5 mg on 11/10 then PAUSE for upcoming breast surgery  Do the best you can with food choices (protein bars, protein shakes, fresh fruit) Keep sweets and treats to a minimum Hydrate well with water and sugar free drinks  Best of luck with upcoming surgery!

## 2023-11-11 ENCOUNTER — Other Ambulatory Visit (HOSPITAL_BASED_OUTPATIENT_CLINIC_OR_DEPARTMENT_OTHER): Payer: Self-pay

## 2023-11-17 ENCOUNTER — Other Ambulatory Visit (HOSPITAL_BASED_OUTPATIENT_CLINIC_OR_DEPARTMENT_OTHER): Payer: Self-pay

## 2023-11-17 MED ORDER — PROMETHAZINE HCL 25 MG PO TABS
25.0000 mg | ORAL_TABLET | Freq: Four times a day (QID) | ORAL | 0 refills | Status: AC | PRN
  Filled 2023-11-17 – 2023-12-05 (×2): qty 28, 7d supply, fill #0

## 2023-11-17 MED ORDER — CEPHALEXIN 500 MG PO CAPS
500.0000 mg | ORAL_CAPSULE | Freq: Four times a day (QID) | ORAL | 0 refills | Status: AC
  Filled 2023-11-17 – 2023-12-05 (×2): qty 14, 4d supply, fill #0

## 2023-11-21 ENCOUNTER — Other Ambulatory Visit (HOSPITAL_BASED_OUTPATIENT_CLINIC_OR_DEPARTMENT_OTHER): Payer: Self-pay

## 2023-11-21 MED ORDER — HYDROCODONE-ACETAMINOPHEN 5-325 MG PO TABS
1.0000 | ORAL_TABLET | ORAL | 0 refills | Status: AC | PRN
  Filled 2023-11-21: qty 28, 4d supply, fill #0
  Filled 2023-12-05: qty 28, 5d supply, fill #0

## 2023-11-28 ENCOUNTER — Other Ambulatory Visit (HOSPITAL_BASED_OUTPATIENT_CLINIC_OR_DEPARTMENT_OTHER): Payer: Self-pay

## 2023-12-05 ENCOUNTER — Other Ambulatory Visit (HOSPITAL_BASED_OUTPATIENT_CLINIC_OR_DEPARTMENT_OTHER): Payer: Self-pay

## 2023-12-22 ENCOUNTER — Other Ambulatory Visit (HOSPITAL_BASED_OUTPATIENT_CLINIC_OR_DEPARTMENT_OTHER): Payer: Self-pay

## 2023-12-22 ENCOUNTER — Encounter: Payer: Self-pay | Admitting: Family Medicine

## 2023-12-22 ENCOUNTER — Ambulatory Visit: Admitting: Family Medicine

## 2023-12-22 VITALS — BP 127/87 | HR 68 | Temp 98.0°F | Ht 66.0 in | Wt 231.0 lb

## 2023-12-22 DIAGNOSIS — Z6837 Body mass index (BMI) 37.0-37.9, adult: Secondary | ICD-10-CM

## 2023-12-22 DIAGNOSIS — E639 Nutritional deficiency, unspecified: Secondary | ICD-10-CM

## 2023-12-22 DIAGNOSIS — E6609 Other obesity due to excess calories: Secondary | ICD-10-CM

## 2023-12-22 DIAGNOSIS — E559 Vitamin D deficiency, unspecified: Secondary | ICD-10-CM | POA: Diagnosis not present

## 2023-12-22 DIAGNOSIS — E66812 Obesity, class 2: Secondary | ICD-10-CM

## 2023-12-22 DIAGNOSIS — N62 Hypertrophy of breast: Secondary | ICD-10-CM | POA: Diagnosis not present

## 2023-12-22 DIAGNOSIS — Z9189 Other specified personal risk factors, not elsewhere classified: Secondary | ICD-10-CM

## 2023-12-22 MED ORDER — ZEPBOUND 5 MG/0.5ML ~~LOC~~ SOAJ
5.0000 mg | SUBCUTANEOUS | 0 refills | Status: AC
  Filled 2023-12-22 – 2024-01-27 (×3): qty 2, 28d supply, fill #0

## 2023-12-22 MED ORDER — VITAMIN D (ERGOCALCIFEROL) 1.25 MG (50000 UNIT) PO CAPS
50000.0000 [IU] | ORAL_CAPSULE | ORAL | 0 refills | Status: AC
  Filled 2023-12-22: qty 5, 35d supply, fill #0

## 2023-12-22 NOTE — Progress Notes (Signed)
 Office: [phone]  /  Fax: [phone]  WEIGHT SUMMARY AND BIOMETRICS  Starting Date: 08/13/21  Starting Weight: 298lb   Weight Lost Since Last Visit: 0lb   Vitals Temp: 98 F (36.7 C) BP: 127/87 Pulse Rate: 68 SpO2: 99 %   Body Composition  Body Fat %: 47.6 % Fat Mass (lbs): 110 lbs Muscle Mass (lbs): 114.8 lbs Total Body Water (lbs): 97.8 lbs Visceral Fat Rating : 10    HPI  Chief Complaint: OBESITY  Tammy [name] is here to discuss her progress with her obesity treatment plan. She is on the the Category 3 Plan and states she is following her eating plan approximately 10 % of the time. She states she is exercising 0 minutes 0 times per week.  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 Vila.  Pharmacotherapy: Off Zepbound  10 mg for the past 5 weeks  PHYSICAL EXAM:  Blood pressure 127/87, pulse 68, temperature 98 F (36.7 C), height 5' 6 (1.676 m), weight 231 lb (104.8 kg), SpO2 99%. Body mass index is 37.28 kg/m.  General: She is overweight, cooperative, alert, well developed, and in no acute distress. PSYCH: Has normal mood, affect and thought process.   Lungs: Normal breathing effort, no conversational dyspnea.  ASSESSMENT AND PLAN  TREATMENT PLAN FOR OBESITY:  Recommended Dietary Goals  Tammy [name] is currently in the action stage of change. As such, her goal is to continue weight management  plan. She has agreed to practicing portion control and making smarter food choices, such as increasing vegetables and decreasing simple carbohydrates.  Behavioral Intervention  We discussed the following Behavioral Modification Strategies today: increasing lean protein intake to established goals, increasing fiber rich foods, increasing water intake , work on meal planning and preparation, keeping healthy foods at home, work on managing stress, creating time for self-care and relaxation, avoiding temptations and identifying enticing environmental cues, continue to work on implementation of reduced calorie nutritional plan, continue to practice mindfulness when eating, and planning for success.  Additional resources provided today: NA  Recommended Physical Activity Goals  Tammy [name] has been advised to work up to 150 minutes of moderate intensity aerobic activity a week and strengthening exercises 2-3 times per week for cardiovascular health, weight loss maintenance and preservation of muscle mass.   She has agreed to Think about enjoyable ways to increase daily physical activity and overcoming barriers to exercise and Increase physical activity in their day and reduce sedentary time (increase NEAT). Wait till cleared by surgeon to resume regular exercise  Pharmacotherapy changes for the treatment of obesity: Resume Zepbound , reducing dose to 5 mg once weekly injection  ASSOCIATED CONDITIONS ADDRESSED TODAY  Has poorly balanced diet Unchanged.  She reports having room for improvement with food choices.  She was limited while staying with her mom after surgery.  She is back into her own place and plans to start grocery shopping  and cooking at home more.  We discussed plan of care on after visit summary incorporating more lean protein and fiber while reducing added sugar.  Vitamin D  deficiency Last vitamin D  Lab Results  Component Value Date   VD25OH 19.5 (L) 03/21/2023   She admits to not  taking her vitamin D  supplement for the past 1 to 2 months.  Her energy level remains fairly poor and her vitamin D  level has been consistently low.  Resume vitamin D  50,000 IU once weekly and repeat lab in 1 to 2 months. -     Vitamin D  (Ergocalciferol ); Take 1 capsule (50,000 Units total) by mouth every 7 (seven) days.  Dispense: 5 capsule; Refill: 0  Class 2 obesity due to excess calories with body mass index (BMI) of 37.0 to 37.9 in adult, unspecified whether serious comorbidity present -     Zepbound ; Inject 5 mg into the skin once a week.  Dispense: 2 mL; Refill: 0 Resume Zepbound  at 5 mg once weekly injection for improved satiety.  Patient was counseled on the importance of maintaining healthy lifestyle habits, including balanced nutrition, regular physical activity, and behavioral modifications, while taking antiobesity medication.  Patient verbalized understanding that medication is an adjunct to, not a replacement for, lifestyle changes and that the long-term success and weight maintenance depend on continued adherence to these strategies.  Macromastia Improving status post surgery recently.  She is healing up well and has a follow-up set up with Dr. Vila.  Await clearance before restarting regular exercise.   She was informed of the importance of frequent follow up visits to maximize her success with intensive lifestyle modifications for her multiple health conditions.   ATTESTASTION STATEMENTS:  Reviewed by clinician on day of visit: allergies, medications, problem list, medical history, surgical history, family history, social history, and previous encounter notes pertinent to obesity diagnosis.   I have personally spent 30 minutes total time today in preparation, patient care, nutritional counseling and education,  and documentation for this visit, including the following: review of most recent clinical lab tests, prescribing medications/ refilling medications, reviewing medical  assistant documentation, review and interpretation of bioimpedence results.     Tammy [name], D.O. DABFM, DABOM [facility] [address] [place], [place] [address] [phone]

## 2023-12-22 NOTE — Patient Instructions (Signed)
 Restart vitamin D  once a week RX  Restart Zepbound , at 5 mg once a week  Eat on a schedule- 3 meals with 1-2 healthy snacks daily Hydrate well with water Stay OFF all sugary drinks! Wait until surgeon clears to you go back to exercise  Protein goal: 90 grams per day Tuna Chicken  Turkey Edamame Chickpeas Lean beef Eggs

## 2023-12-30 ENCOUNTER — Other Ambulatory Visit (HOSPITAL_BASED_OUTPATIENT_CLINIC_OR_DEPARTMENT_OTHER): Payer: Self-pay

## 2024-01-03 ENCOUNTER — Other Ambulatory Visit (HOSPITAL_BASED_OUTPATIENT_CLINIC_OR_DEPARTMENT_OTHER): Payer: Self-pay

## 2024-01-13 ENCOUNTER — Telehealth (HOSPITAL_COMMUNITY): Payer: Self-pay | Admitting: Pharmacy Technician

## 2024-01-13 ENCOUNTER — Other Ambulatory Visit (HOSPITAL_COMMUNITY): Payer: Self-pay

## 2024-01-17 ENCOUNTER — Telehealth: Payer: Self-pay | Admitting: Family Medicine

## 2024-01-17 NOTE — Telephone Encounter (Signed)
 Pt is calling because she has not stared the medication that required a PA. The new RX was sent in after christmas and she has not had any update about the RX Zepbound .

## 2024-01-19 ENCOUNTER — Ambulatory Visit: Admitting: Family Medicine

## 2024-01-19 ENCOUNTER — Other Ambulatory Visit (HOSPITAL_COMMUNITY): Payer: Self-pay

## 2024-01-23 NOTE — Telephone Encounter (Signed)
 Insurance needed additional information. Faxed over the information needed. Waiting to hear back.

## 2024-01-24 ENCOUNTER — Other Ambulatory Visit (HOSPITAL_COMMUNITY): Payer: Self-pay

## 2024-01-27 ENCOUNTER — Other Ambulatory Visit (HOSPITAL_BASED_OUTPATIENT_CLINIC_OR_DEPARTMENT_OTHER): Payer: Self-pay

## 2024-01-30 ENCOUNTER — Other Ambulatory Visit (HOSPITAL_COMMUNITY): Payer: Self-pay

## 2024-04-13 ENCOUNTER — Ambulatory Visit (HOSPITAL_BASED_OUTPATIENT_CLINIC_OR_DEPARTMENT_OTHER): Admitting: Obstetrics & Gynecology
